# Patient Record
Sex: Male | Born: 1970 | Race: White | Hispanic: No | Marital: Married | Smoking: Former smoker
Health system: Southern US, Community
[De-identification: ages and names within clinical notes are randomized; demographics above are authoritative.]

## PROBLEM LIST (undated history)

## (undated) DIAGNOSIS — F329 Major depressive disorder, single episode, unspecified: Secondary | ICD-10-CM

## (undated) DIAGNOSIS — K760 Fatty (change of) liver, not elsewhere classified: Secondary | ICD-10-CM

## (undated) DIAGNOSIS — F32A Depression, unspecified: Secondary | ICD-10-CM

## (undated) DIAGNOSIS — E785 Hyperlipidemia, unspecified: Secondary | ICD-10-CM

## (undated) DIAGNOSIS — K219 Gastro-esophageal reflux disease without esophagitis: Secondary | ICD-10-CM

## (undated) DIAGNOSIS — I10 Essential (primary) hypertension: Secondary | ICD-10-CM

## (undated) HISTORY — DX: Major depressive disorder, single episode, unspecified: F32.9

## (undated) HISTORY — DX: Essential (primary) hypertension: I10

## (undated) HISTORY — PX: VASECTOMY: SHX75

## (undated) HISTORY — DX: Fatty (change of) liver, not elsewhere classified: K76.0

## (undated) HISTORY — DX: Gastro-esophageal reflux disease without esophagitis: K21.9

## (undated) HISTORY — DX: Hyperlipidemia, unspecified: E78.5

## (undated) HISTORY — DX: Depression, unspecified: F32.A

## (undated) HISTORY — PX: ADENOIDECTOMY: SUR15

---

## 2004-10-23 ENCOUNTER — Ambulatory Visit: Payer: Self-pay | Admitting: Family Medicine

## 2004-12-04 ENCOUNTER — Ambulatory Visit: Payer: Self-pay | Admitting: Family Medicine

## 2004-12-10 ENCOUNTER — Ambulatory Visit: Payer: Self-pay | Admitting: Family Medicine

## 2005-05-22 ENCOUNTER — Ambulatory Visit: Payer: Self-pay | Admitting: Family Medicine

## 2005-05-27 ENCOUNTER — Ambulatory Visit: Payer: Self-pay | Admitting: Family Medicine

## 2005-06-19 ENCOUNTER — Ambulatory Visit: Payer: Self-pay | Admitting: Family Medicine

## 2006-03-11 ENCOUNTER — Ambulatory Visit: Payer: Self-pay | Admitting: Family Medicine

## 2006-10-23 ENCOUNTER — Ambulatory Visit: Payer: Self-pay | Admitting: Family Medicine

## 2006-12-23 ENCOUNTER — Ambulatory Visit: Payer: Self-pay | Admitting: Family Medicine

## 2007-05-26 DIAGNOSIS — I10 Essential (primary) hypertension: Secondary | ICD-10-CM | POA: Insufficient documentation

## 2007-05-26 DIAGNOSIS — E785 Hyperlipidemia, unspecified: Secondary | ICD-10-CM | POA: Insufficient documentation

## 2007-06-18 ENCOUNTER — Ambulatory Visit: Payer: Self-pay | Admitting: Family Medicine

## 2007-06-20 LAB — CONVERTED CEMR LAB
CO2: 31 meq/L (ref 19–32)
Cholesterol: 165 mg/dL (ref 0–200)
GFR calc Af Amer: 108 mL/min
Glucose, Bld: 93 mg/dL (ref 70–99)
HDL: 51.7 mg/dL (ref >39.0)
Potassium: 4.7 meq/L (ref 3.5–5.1)
Sodium: 139 meq/L (ref 135–145)
Total CHOL/HDL Ratio: 3.2
Triglycerides: 52 mg/dL (ref 0–149)

## 2007-06-22 ENCOUNTER — Ambulatory Visit: Payer: Self-pay | Admitting: Family Medicine

## 2007-06-22 DIAGNOSIS — K219 Gastro-esophageal reflux disease without esophagitis: Secondary | ICD-10-CM | POA: Insufficient documentation

## 2007-09-20 ENCOUNTER — Ambulatory Visit: Payer: Self-pay | Admitting: Family Medicine

## 2008-01-10 ENCOUNTER — Ambulatory Visit: Payer: Self-pay | Admitting: Family Medicine

## 2008-01-10 DIAGNOSIS — F329 Major depressive disorder, single episode, unspecified: Secondary | ICD-10-CM

## 2008-01-10 DIAGNOSIS — F32A Depression, unspecified: Secondary | ICD-10-CM | POA: Insufficient documentation

## 2008-02-17 ENCOUNTER — Ambulatory Visit: Payer: Self-pay | Admitting: Family Medicine

## 2008-06-09 ENCOUNTER — Telehealth (INDEPENDENT_AMBULATORY_CARE_PROVIDER_SITE_OTHER): Payer: Self-pay | Admitting: Internal Medicine

## 2008-06-26 ENCOUNTER — Ambulatory Visit: Payer: Self-pay | Admitting: Family Medicine

## 2008-06-26 LAB — CONVERTED CEMR LAB
AST: 21 units/L (ref 0–37)
Chloride: 106 meq/L (ref 96–112)
Creatinine, Ser: 0.9 mg/dL (ref 0.4–1.5)
GFR calc non Af Amer: 100 mL/min
LDL Cholesterol: 98 mg/dL (ref 0–99)
Potassium: 4.3 meq/L (ref 3.5–5.1)
TSH: 0.73 microintl units/mL (ref 0.35–5.50)
Total Bilirubin: 0.7 mg/dL (ref 0.3–1.2)
Total CHOL/HDL Ratio: 3.1
Triglycerides: 57 mg/dL (ref 0–149)

## 2008-06-29 ENCOUNTER — Ambulatory Visit: Payer: Self-pay | Admitting: Family Medicine

## 2008-10-03 ENCOUNTER — Ambulatory Visit: Payer: Self-pay | Admitting: Family Medicine

## 2008-12-28 ENCOUNTER — Ambulatory Visit: Payer: Self-pay | Admitting: Family Medicine

## 2008-12-28 LAB — CONVERTED CEMR LAB
CO2: 32 meq/L (ref 19–32)
Chloride: 101 meq/L (ref 96–112)
Creatinine, Ser: 1 mg/dL (ref 0.4–1.5)

## 2009-04-25 ENCOUNTER — Ambulatory Visit: Payer: Self-pay | Admitting: Family Medicine

## 2009-04-25 DIAGNOSIS — S335XXA Sprain of ligaments of lumbar spine, initial encounter: Secondary | ICD-10-CM | POA: Insufficient documentation

## 2009-07-11 ENCOUNTER — Telehealth: Payer: Self-pay | Admitting: Family Medicine

## 2010-04-08 ENCOUNTER — Ambulatory Visit: Payer: Self-pay | Admitting: Family Medicine

## 2010-04-12 ENCOUNTER — Ambulatory Visit: Payer: Self-pay | Admitting: Family Medicine

## 2010-04-15 LAB — CONVERTED CEMR LAB
ALT: 29 units/L (ref 0–53)
AST: 26 units/L (ref 0–37)
Albumin: 4.3 g/dL (ref 3.5–5.2)
BUN: 10 mg/dL (ref 6–23)
CO2: 27 meq/L (ref 19–32)
Chloride: 103 meq/L (ref 96–112)
Cholesterol: 176 mg/dL (ref 0–200)
GC Probe Amp, Urine: NEGATIVE
GFR calc non Af Amer: 81.86 mL/min (ref >60)
Glucose, Bld: 92 mg/dL (ref 70–99)
Potassium: 4.1 meq/L (ref 3.5–5.1)
Total Protein: 7.2 g/dL (ref 6.0–8.3)

## 2010-10-15 NOTE — Assessment & Plan Note (Signed)
Summary: Scott Franco FROM [facility]/MED REFILL/DLO   Vital Signs:  Patient profile:   40 year old male Height:      69 inches Weight:      234 pounds BMI:     34.68 Temp:     98.4 degrees F oral Pulse rate:   64 / minute Pulse rhythm:   regular BP sitting:   140 / 90  (left arm) Cuff size:   large  Vitals Entered By: Lowella Petties CMA (2010-04-13 11:22 AM) CC: CPX- transfer from [facility]   History of Present Illness: CPE-See plan.  Asking about Std testing.  No symptoms of discharge, burning, etc.   Depression: On zoloft for last 2 years.  Started taking it in middle of maritial troubles.  No SI/HI.  "I think it helped."  Mood has been stable per patient.  He thinks that as divorce proceedings wind down his mood will continue to improve.  Sleeping fairly well.  Bright outlook EA:VWUJ and his future.   Hypertension:      Using medication without problems or lightheadedness: yes Chest pain with exertion:no Edema:no Short of breath:no Average home BPs: usually lower than today.  Other issues: no  s/p vasectomy and needs follow up sperm eval.    Problems Prior to Update: 1)  Vasectomy, Hx of  (ICD-V26.52) 2)  Lumbar Strain, Acute  (ICD-847.2) 3)  Depression  (ICD-311) 4)  Health Maintenance Exam  (ICD-V70.0) 5)  Hypertension  (ICD-401.9) 6)  Hyperlipidemia  (ICD-272.4) 7)  Gerd  (ICD-530.81)  Current Medications (verified): 1)  Lisinopril 20 Mg Tabs (Lisinopril) .... One Tab By Mouth At Night. 2)  Zoloft 50 Mg  Tabs (Sertraline Hcl) .Marland Kitchen.. 1 Once Daily For Depression  Allergies (verified): No Known Drug Allergies  Past History:  Past Medical History: Last updated: 05/25/2007 GERD Hyperlipidemia Hypertension  Past Surgical History: Last updated: 06/22/2007 Adenoidectomy 3yoa Vasectomy (Dr Reuel Boom) 01/2005  Family History: Last updated: 04/13/10 Father: deceased age 74- lung cancer, smoker. 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   Risk Factors: Smoking Status: quit (05/25/2007) Passive Smoke Exposure: no (06/29/2008)  Family History: Father: deceased age 68- lung cancer, smoker. HTN Mother: A  57 HTN Sister A 64  Social History: Reviewed history from 05/25/2007 and no changes required. 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   Review of Systems       See HPI.  Otherwise negative.    Physical Exam  General:  GEN: nad, alert and oriented, affect wnl HEENT: mucous membranes moist NECK: supple w/o LA CV: rrr.  no murmur PULM: ctab, no inc wob ABD: soft, +bs EXT: no edema SKIN: no acute rash, multiple benign appearing nevi on trunk.    Impression & Recommendations:  Problem # 1:  HEALTH MAINTENANCE EXAM (ICD-V70.0) return for labs.   Td up to date.  d/w patient re:flu.  d/w patient alcohol in moderation and exercise.   Problem # 2:  DEPRESSION (ICD-311) No change in meds.  Pt to notify me if  symptoms increase.  His updated medication list for this problem includes:    Zoloft 50 Mg Tabs (  Sertraline hcl) .Marland Kitchen... 1 once daily for depression  Problem # 3:  HYPERTENSION (ICD-401.9) Controlled.  No change in meds.  return for labs.  His updated medication list for this problem includes:    Lisinopril 20 Mg Tabs (Lisinopril) ..... One tab by mouth at night.  Problem # 4:  VASECTOMY, HX OF (ICD-V26.52) return for sperm eval.   Complete Medication List: 1)  Lisinopril 20 Mg Tabs (Lisinopril) .... One tab by mouth at night. 2)  Zoloft 50 Mg Tabs (Sertraline hcl) .Marland Kitchen.. 1 once daily for depression  Patient Instructions: 1)  return for fasting labs.   2)  CMET/lipid  401.1 3)  HIV/RPR v01.6 4)  GC/chlam-urine v01.6 5)  sperm count post vasectomy v26.52 6)  I sent your prescriptions to the pharmacy.  Prescriptions: ZOLOFT 50 MG  TABS (SERTRALINE HCL) 1 once daily for depression  #90 x 3   Entered and Authorized by:   Crawford Givens MD   Signed by:   Crawford Givens MD on 04/08/2010   Method used:   [facility]        [facility] [facility]ail[facility]       [address]       [place], [place]  [address]       Ph: [phone] or [phone]       Fax: [phone]   RxID:   [id number] LISINOPRIL 20 MG TABS (LISINOPRIL) one tab by mouth at night.  #90 x 3   Entered and Authorized by:   Crawford Givens MD   Signed by:   Crawford Givens MD on 04/08/2010   Method used:   [facility]        [facility] (retail)       [address]       [place], [place]  [address]       Ph: [phone] or [phone]       Fax: [phone]   RxID:   [id number]   Prior Medications (reviewed today): LISINOPRIL 20 MG TABS (LISINOPRIL) one tab by mouth at night. ZOLOFT 50 MG  TABS (SERTRALINE HCL) 1 once daily for depression Current Allergies (reviewed today): No known allergies

## 2010-12-03 ENCOUNTER — Emergency Department: Payer: Self-pay | Admitting: Emergency Medicine

## 2011-04-11 ENCOUNTER — Other Ambulatory Visit: Payer: Self-pay | Admitting: Family Medicine

## 2011-04-11 NOTE — Telephone Encounter (Signed)
Ok to refill? Patient was last seen over a year ago.

## 2011-04-12 NOTE — Telephone Encounter (Signed)
Needs office visit.  rx sent in meantime.  Please set up CPE.  Thanks.

## 2011-04-14 MED ORDER — LISINOPRIL 20 MG PO TABS: 20.0000 mg | ORAL_TABLET | Freq: Every day | ORAL | Status: DC

## 2011-04-14 MED ORDER — SERTRALINE HCL 50 MG PO TABS: 50.0000 mg | ORAL_TABLET | Freq: Every day | ORAL | Status: DC

## 2011-04-14 NOTE — Telephone Encounter (Signed)
rx sent to pharmacy by e-script .left message to have patient call for appt for cpx.

## 2011-04-23 ENCOUNTER — Other Ambulatory Visit: Payer: Self-pay | Admitting: Family Medicine

## 2011-04-23 DIAGNOSIS — I10 Essential (primary) hypertension: Secondary | ICD-10-CM

## 2011-04-30 ENCOUNTER — Encounter: Payer: Self-pay | Admitting: Family Medicine

## 2011-04-30 ENCOUNTER — Ambulatory Visit (INDEPENDENT_AMBULATORY_CARE_PROVIDER_SITE_OTHER): Payer: BC Managed Care – PPO | Admitting: Family Medicine

## 2011-04-30 DIAGNOSIS — R059 Cough, unspecified: Secondary | ICD-10-CM | POA: Insufficient documentation

## 2011-04-30 DIAGNOSIS — R05 Cough: Secondary | ICD-10-CM | POA: Insufficient documentation

## 2011-04-30 MED ORDER — BENZONATATE 200 MG PO CAPS: 200.0000 mg | ORAL_CAPSULE | Freq: Three times a day (TID) | ORAL | Status: AC | PRN

## 2011-04-30 NOTE — Progress Notes (Signed)
Divorce hearing is pending, 05/06/11.   duration of symptoms: almost 1 week rhinorrhea:no congestion:minimal ear pain: no sore throat:occ dry throat and raspy feeling in throat Cough:yes, occ fits of coughing, occ sputum Myalgias: occ in upper chest other concerns: no fevers, but occ feels hot.   ROS: See HPI.  Otherwise negative.    Meds, vitals, and allergies reviewed.   GEN: nad, alert and oriented HEENT: mucous membranes moist, TM w/o erythema, nasal epithelium mildly injected, OP with cobblestoning- mild NECK: supple w/o LA CV: rrr. PULM: ctab, no inc wob ABD: soft, +bs EXT: no edema

## 2011-04-30 NOTE — Patient Instructions (Signed)
Drink plenty of fluids, take tylenol as needed, and take tessalon as needed for the cough.  This should gradually improve.  Take care.  Let us know if you have other concerns.

## 2011-04-30 NOTE — Assessment & Plan Note (Signed)
Likely viral, nontoxic, and lungs ctab.  Would try tessalon prn and supportive tx o/w.  D/w pt about ddx and he agrees, f/u prn.

## 2011-05-02 ENCOUNTER — Other Ambulatory Visit (INDEPENDENT_AMBULATORY_CARE_PROVIDER_SITE_OTHER): Payer: BC Managed Care – PPO

## 2011-05-02 DIAGNOSIS — I10 Essential (primary) hypertension: Secondary | ICD-10-CM

## 2011-05-02 LAB — COMPREHENSIVE METABOLIC PANEL
ALT: 38 U/L (ref 0–53)
AST: 27 U/L (ref 0–37)
Albumin: 4.3 g/dL (ref 3.5–5.2)
CO2: 29 mEq/L (ref 19–32)
Calcium: 9.1 mg/dL (ref 8.4–10.5)
Chloride: 102 mEq/L (ref 96–112)
Creatinine, Ser: 1.1 mg/dL (ref 0.4–1.5)
GFR: 76.43 mL/min (ref >60.00)
Potassium: 4.6 mEq/L (ref 3.5–5.1)
Total Protein: 7.3 g/dL (ref 6.0–8.3)

## 2011-05-02 LAB — LIPID PANEL
LDL Cholesterol: 110 mg/dL — ABNORMAL HIGH (ref 0–99)
Total CHOL/HDL Ratio: 4
Triglycerides: 141 mg/dL (ref 0.0–149.0)

## 2011-05-12 ENCOUNTER — Encounter: Payer: Self-pay | Admitting: Family Medicine

## 2011-05-13 ENCOUNTER — Ambulatory Visit (INDEPENDENT_AMBULATORY_CARE_PROVIDER_SITE_OTHER): Payer: BC Managed Care – PPO | Admitting: Family Medicine

## 2011-05-13 ENCOUNTER — Encounter: Payer: Self-pay | Admitting: Family Medicine

## 2011-05-13 VITALS — BP 122/84 | HR 68 | Temp 98.7°F | Wt 241.1 lb

## 2011-05-13 DIAGNOSIS — R739 Hyperglycemia, unspecified: Secondary | ICD-10-CM

## 2011-05-13 DIAGNOSIS — R7309 Other abnormal glucose: Secondary | ICD-10-CM

## 2011-05-13 DIAGNOSIS — D229 Melanocytic nevi, unspecified: Secondary | ICD-10-CM

## 2011-05-13 DIAGNOSIS — D239 Other benign neoplasm of skin, unspecified: Secondary | ICD-10-CM

## 2011-05-13 DIAGNOSIS — F329 Major depressive disorder, single episode, unspecified: Secondary | ICD-10-CM

## 2011-05-13 DIAGNOSIS — F3289 Other specified depressive episodes: Secondary | ICD-10-CM

## 2011-05-13 DIAGNOSIS — I10 Essential (primary) hypertension: Secondary | ICD-10-CM

## 2011-05-13 DIAGNOSIS — Z0000 Encounter for general adult medical examination without abnormal findings: Secondary | ICD-10-CM

## 2011-05-13 NOTE — Progress Notes (Signed)
Divorce is done and equitable distribution is pending.  Split custody, alternating weeks with custody of son since this summer.  Son is adjusting.  "I'm doing okay.  I have some trouble sleeping every once in a while."  Occ with depressed mood; continued on meds with some help (it's helped with some social phobia).  No SI.  He does have hope.    Hypertension:    Using medication without problems or lightheadedness: yes Chest pain with exertion:no Edema:no Short of breath: he attributes this to deconditioning. Gradual change over the last year.  Has improved prev with weight loss.   Mild hyperglycemia.  We talked about this and prediabetes.   CPE- See plan.  Routine anticipatory guidance given to patient.  See health maintenance.  We talked about drinking less.  He's been abstinent before.    He was concerned about nevi on skin exam.   PMH and SH reviewed  Meds, vitals, and allergies reviewed.   ROS: See HPI.  Otherwise negative.    GEN: nad, alert and oriented HEENT: mucous membranes moist NECK: supple w/o LA CV: rrr. PULM: ctab, no inc wob ABD: soft, +bs EXT: no edema SKIN: no acute rash and mult nevi noted, bland in appearance

## 2011-05-13 NOTE — Patient Instructions (Addendum)
I would get a flu shot each fall.   Gradually increase your exercise and cut down on beer.   Take care.  Glad to see you. Let me know if you have concerns.   See Shirlee Limerick about your referral before your leave today.

## 2011-05-15 DIAGNOSIS — D229 Melanocytic nevi, unspecified: Secondary | ICD-10-CM | POA: Insufficient documentation

## 2011-05-15 DIAGNOSIS — Z0000 Encounter for general adult medical examination without abnormal findings: Secondary | ICD-10-CM | POA: Insufficient documentation

## 2011-05-15 DIAGNOSIS — R739 Hyperglycemia, unspecified: Secondary | ICD-10-CM | POA: Insufficient documentation

## 2011-05-15 NOTE — Assessment & Plan Note (Signed)
Healthy habits d/w pt, less etoh, more exercise.  Flu shot encouraged.  Labs d/w pt.

## 2011-05-15 NOTE — Assessment & Plan Note (Signed)
Refer to derm per pt request.  

## 2011-05-15 NOTE — Assessment & Plan Note (Signed)
Continue with current meds.  Affect wnl and no SI/HI.  Hopefully this will improve as his situation settles down into a more predictable pattern.

## 2011-05-15 NOTE — Assessment & Plan Note (Signed)
Controlled, cont current meds. D/w pt about weight loss.

## 2011-05-15 NOTE — Assessment & Plan Note (Signed)
D/w pt about diet and exercise.  Recheck periodically.  

## 2011-07-16 ENCOUNTER — Other Ambulatory Visit: Payer: Self-pay | Admitting: *Deleted

## 2011-07-16 MED ORDER — LISINOPRIL 20 MG PO TABS: 20.0000 mg | ORAL_TABLET | Freq: Every day | ORAL | Status: DC

## 2011-07-16 NOTE — Telephone Encounter (Signed)
Last refill 04/14/2011.

## 2011-07-17 MED ORDER — SERTRALINE HCL 50 MG PO TABS: 50.0000 mg | ORAL_TABLET | Freq: Every day | ORAL | Status: DC

## 2011-12-17 ENCOUNTER — Telehealth: Payer: Self-pay

## 2011-12-17 NOTE — Telephone Encounter (Signed)
Pt wants to discuss Zoloft with Dr Para March. I asked pt if he was having a problem with medication and he said no. Pt said he would tell me it had to do with decreased sex drive but other than that he wanted to speak with Dr Para March. [facility] uses [facility] and can be reached at [phone].

## 2011-12-17 NOTE — Telephone Encounter (Signed)
Patient notified as instructed by telephone. Pt said it would be fine to come in and talk with Dr Para March. Jamie scheduled for 30 min appt on 12/22/11 at 9:15am.

## 2011-12-17 NOTE — Telephone Encounter (Signed)
He likely needs an OV.  If there is any concern about low testosterone, get him an early AM OV so we can discuss and potentially do any labs the same day.  Doesn't have to fast.  would be useful.

## 2011-12-22 ENCOUNTER — Encounter: Payer: Self-pay | Admitting: Family Medicine

## 2011-12-22 ENCOUNTER — Ambulatory Visit (INDEPENDENT_AMBULATORY_CARE_PROVIDER_SITE_OTHER): Payer: BC Managed Care – PPO | Admitting: Family Medicine

## 2011-12-22 VITALS — BP 116/70 | HR 69 | Temp 98.6°F | Wt 231.0 lb

## 2011-12-22 DIAGNOSIS — N529 Male erectile dysfunction, unspecified: Secondary | ICD-10-CM

## 2011-12-22 MED ORDER — SILDENAFIL CITRATE 100 MG PO TABS: 50.0000 mg | ORAL_TABLET | Freq: Every day | ORAL | Status: DC | PRN

## 2011-12-22 NOTE — Progress Notes (Signed)
Divorce is done and equitable distribution is done. Split custody, alternating weeks with custody of son since last summer.   Sleep is okay and mood is okay.  Continued on SSRI with some help (it's helped with some social phobia).  He quit drinking and started exercising.  His boss retired and he got a new boss that he likes.  All of that helped.  No SI/HI.  He does have hope.   Has a new girlfriend.  He had some mild trouble with sex drive but also had an episode of ED with her.  Still can get AM erection.  On SSRI.  No etoh now.    Meds, vitals, and allergies reviewed.   ROS: See HPI.  Otherwise, noncontributory.  GEN: nad, alert and oriented NECK: supple w/o LA CV: rrr.  PULM: ctab, no inc wob ABD: soft, +bs EXT: no edema SKIN: no acute rash

## 2011-12-22 NOTE — Assessment & Plan Note (Signed)
We may need to change the SSRI in the future.  In meantime, I would try prn viagra with routine instructions.  That may be all he needs to overcome the symptoms and return to typical function.  He agrees.  I doubt low T with the normal AM erections and still with fair libido.  Social considerations likely play a role.  Partner is supportive.

## 2011-12-22 NOTE — Patient Instructions (Signed)
Try the viagra and see if that helps.  Let me know if you have concerns. Glad to see you.

## 2012-02-19 IMAGING — CR DG HAND COMPLETE 3+V*L*
1 series · 3 of 3 positions shown · non-contrast
Comparison: none

REASON FOR EXAM: pain, swelling    RME 3
COMMENTS:   LMP: (Male)

[Series 1: view not recorded · 0.17mm/px · 3 of 3 slices shown]
[im 1/3]
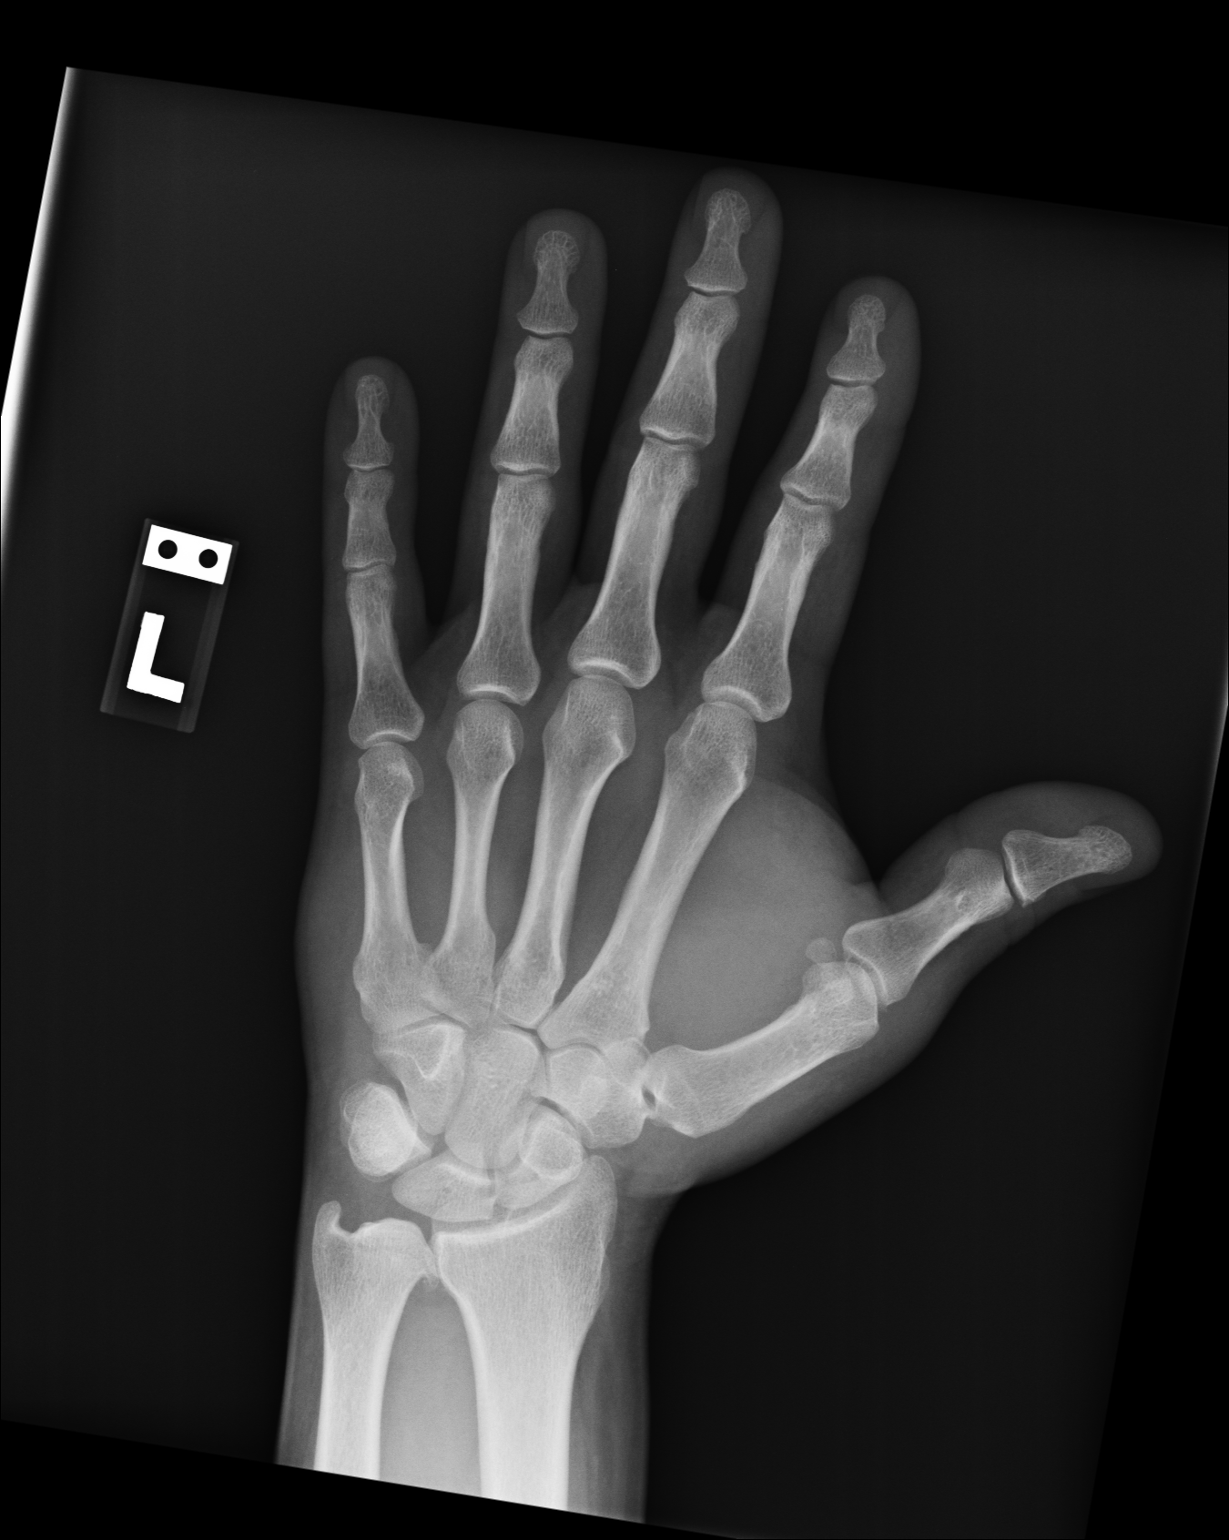
[im 2/3]
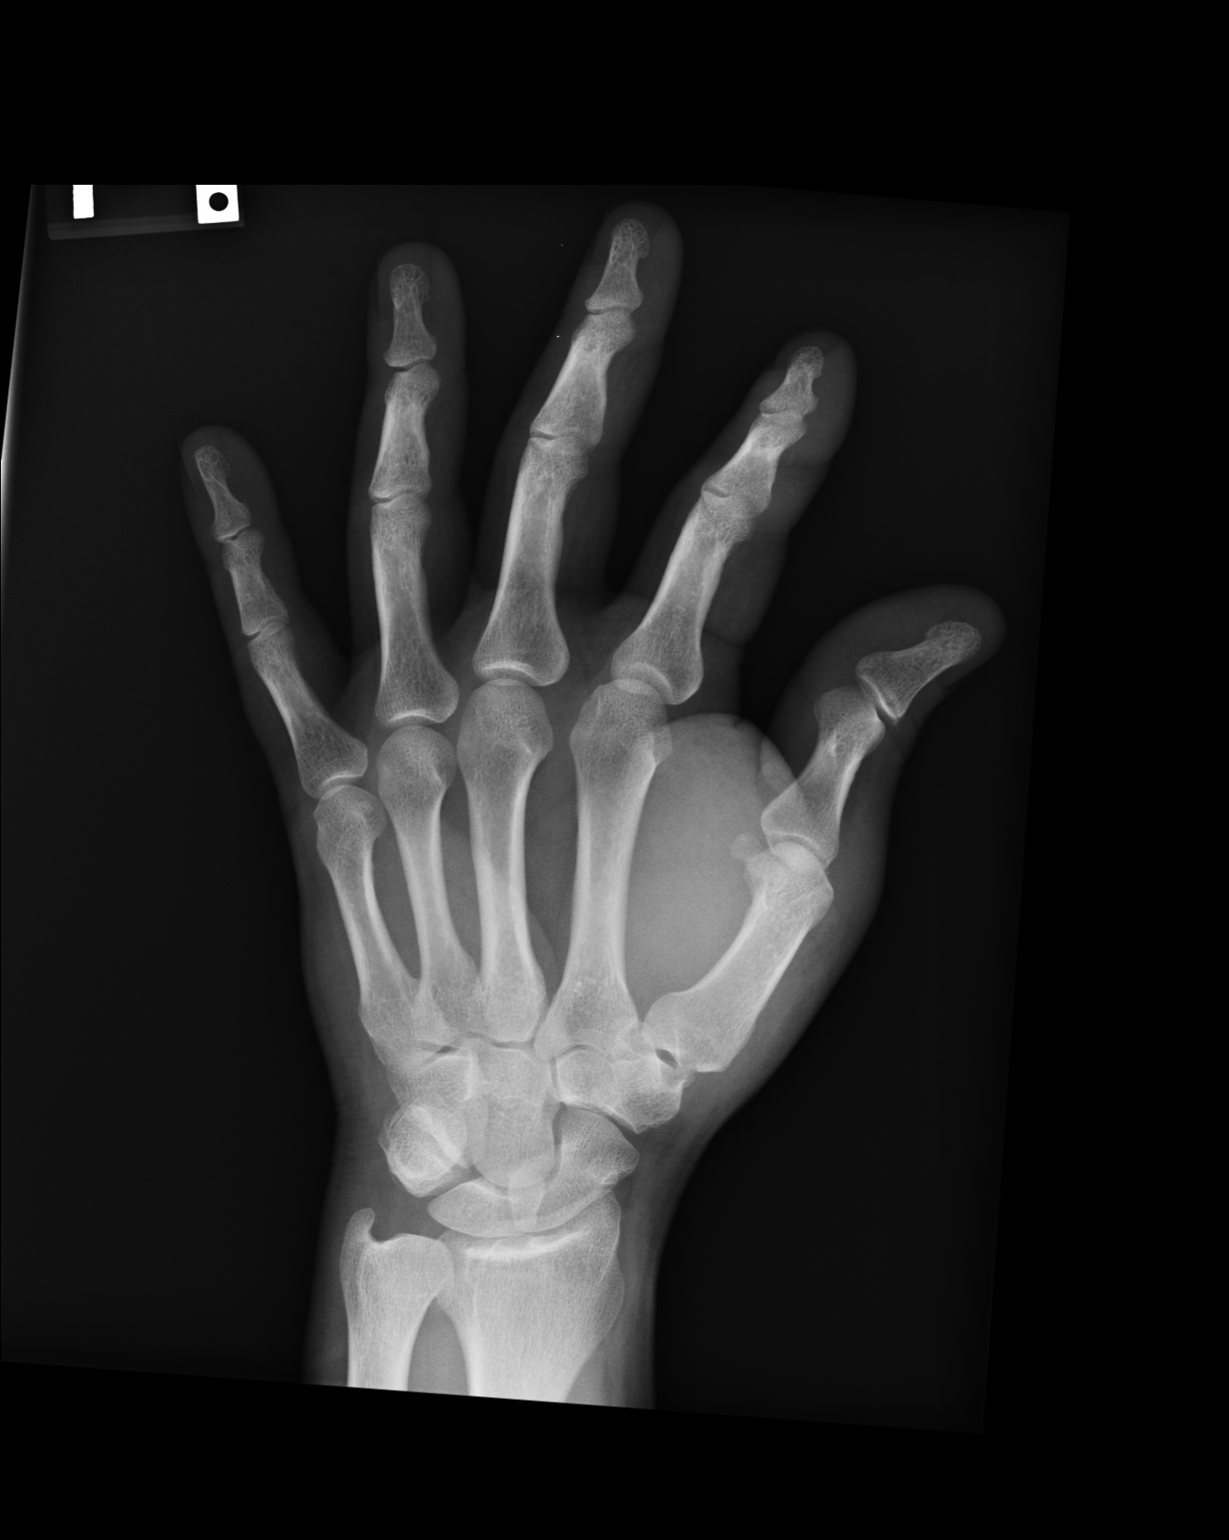
[im 3/3]
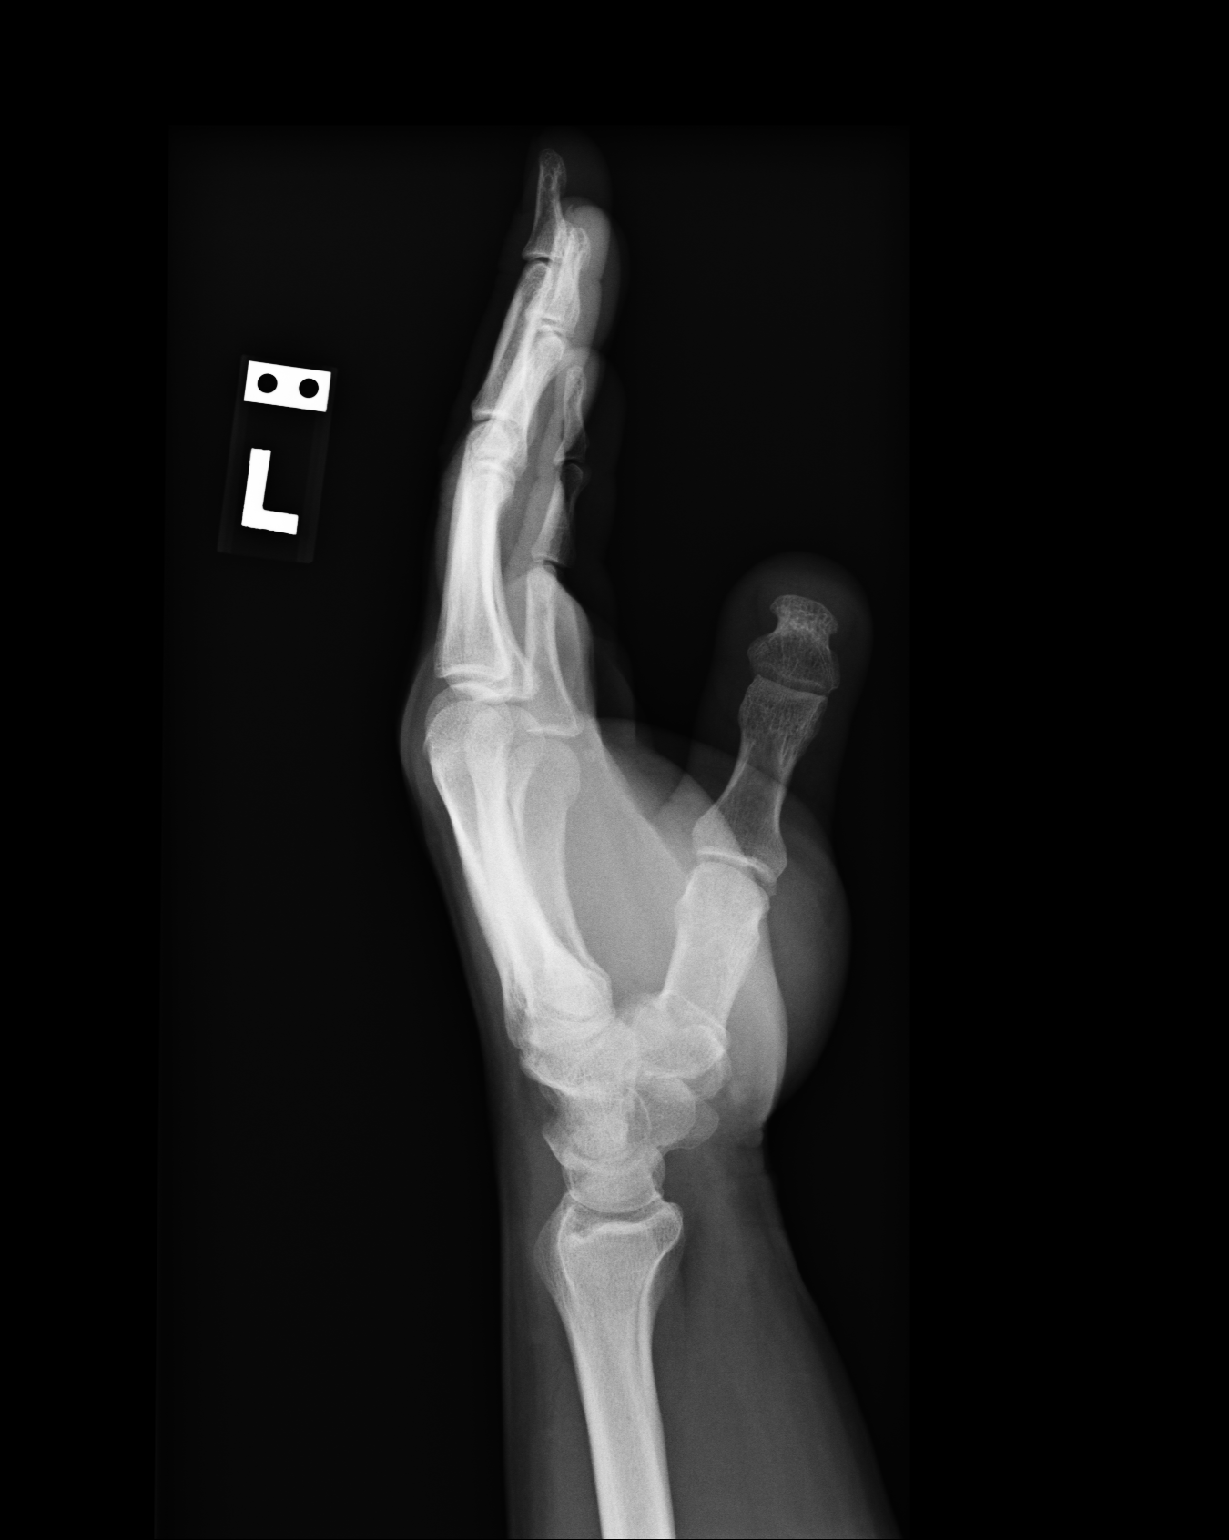

[3 of 3 positions shown; findings below may reference images not displayed]

PROCEDURE:     DXR - DXR HAND LT COMPLETE  W/OBLIQUES  - December 03, 2010  [DATE]

RESULT:     Three views of the left hand are submitted. The bones are
adequately mineralized. There is mild prominence of the soft tissues of the
thenar eminence. I see no fracture nor dislocation nor radiopaque foreign
body.
IMPRESSION: There is soft tissue swelling in the thenar region.
Correlation with patient's clinical and laboratory values is needed.

## 2012-04-07 ENCOUNTER — Other Ambulatory Visit: Payer: Self-pay | Admitting: *Deleted

## 2012-04-07 MED ORDER — LISINOPRIL 20 MG PO TABS: 20.0000 mg | ORAL_TABLET | Freq: Every day | ORAL | Status: DC

## 2012-04-07 NOTE — Telephone Encounter (Signed)
Received faxed refill request from pharmacy. Refill sent to pharmacy electronically. 

## 2012-07-06 ENCOUNTER — Other Ambulatory Visit: Payer: Self-pay | Admitting: *Deleted

## 2012-07-06 MED ORDER — SERTRALINE HCL 50 MG PO TABS: 50.0000 mg | ORAL_TABLET | Freq: Every day | ORAL | Status: DC

## 2012-07-22 ENCOUNTER — Other Ambulatory Visit: Payer: Self-pay | Admitting: *Deleted

## 2012-07-23 ENCOUNTER — Other Ambulatory Visit: Payer: Self-pay | Admitting: *Deleted

## 2012-07-23 MED ORDER — LISINOPRIL 20 MG PO TABS: 20.0000 mg | ORAL_TABLET | Freq: Every day | ORAL | Status: DC

## 2012-08-20 ENCOUNTER — Encounter: Payer: Self-pay | Admitting: Family Medicine

## 2012-08-20 ENCOUNTER — Ambulatory Visit (INDEPENDENT_AMBULATORY_CARE_PROVIDER_SITE_OTHER): Payer: BC Managed Care – PPO | Admitting: Family Medicine

## 2012-08-20 VITALS — BP 124/80 | HR 62 | Temp 99.0°F | Wt 236.8 lb

## 2012-08-20 DIAGNOSIS — B9789 Other viral agents as the cause of diseases classified elsewhere: Secondary | ICD-10-CM

## 2012-08-20 DIAGNOSIS — J029 Acute pharyngitis, unspecified: Secondary | ICD-10-CM

## 2012-08-20 DIAGNOSIS — I10 Essential (primary) hypertension: Secondary | ICD-10-CM

## 2012-08-20 DIAGNOSIS — B349 Viral infection, unspecified: Secondary | ICD-10-CM

## 2012-08-20 LAB — POCT RAPID STREP A (OFFICE): Rapid Strep A Screen: NEGATIVE

## 2012-08-20 MED ORDER — LISINOPRIL 20 MG PO TABS: 20.0000 mg | ORAL_TABLET | Freq: Every day | ORAL | Status: DC

## 2012-08-20 NOTE — Patient Instructions (Signed)
Come back for fasting labs.   You can get results through the computer.   Take care.

## 2012-08-20 NOTE — Progress Notes (Signed)
RST neg.    In last 2 weeks he started having a tickle in his throat.  Sick contacts, girlfriend.  Then had ST, swollen glands in neck. Back aches and myalgias. He has some sweats at night recently.  He feels some better today except for feeling mildly sweaty.  Clearing his throat more than true cough.  Voice was altered prev, but resolved now.    Meds, vitals, and allergies reviewed.   ROS: See HPI.  Otherwise, noncontributory.  GEN: nad, alert and oriented HEENT: mucous membranes moist, tm w/o erythema, nasal exam w/o erythema, scant clear discharge noted,  OP with very mild cobblestoning NECK: supple w/o LA CV: rrr.   PULM: ctab, no inc wob EXT: no edema SKIN: no acute rash

## 2012-08-22 DIAGNOSIS — B349 Viral infection, unspecified: Secondary | ICD-10-CM | POA: Insufficient documentation

## 2012-08-22 NOTE — Assessment & Plan Note (Signed)
Controlled, return for fasting labs and continue current meds.

## 2012-08-22 NOTE — Assessment & Plan Note (Signed)
RST neg, likely viral and resolving.  Nontoxic.  Reassured.

## 2012-08-25 ENCOUNTER — Other Ambulatory Visit (INDEPENDENT_AMBULATORY_CARE_PROVIDER_SITE_OTHER): Payer: BC Managed Care – PPO

## 2012-08-25 DIAGNOSIS — I10 Essential (primary) hypertension: Secondary | ICD-10-CM

## 2012-08-25 LAB — COMPREHENSIVE METABOLIC PANEL
BUN: 19 mg/dL (ref 6–23)
CO2: 28 mEq/L (ref 19–32)
Calcium: 9 mg/dL (ref 8.4–10.5)
Chloride: 100 mEq/L (ref 96–112)
Creatinine, Ser: 1 mg/dL (ref 0.4–1.5)
GFR: 84.6 mL/min (ref >60.00)
Total Bilirubin: 1 mg/dL (ref 0.3–1.2)

## 2012-08-25 LAB — LIPID PANEL
Cholesterol: 170 mg/dL (ref 0–200)
HDL: 30.1 mg/dL — ABNORMAL LOW (ref >39.00)
LDL Cholesterol: 117 mg/dL — ABNORMAL HIGH (ref 0–99)
Triglycerides: 114 mg/dL (ref 0.0–149.0)

## 2012-08-29 ENCOUNTER — Encounter: Payer: Self-pay | Admitting: Family Medicine

## 2012-10-30 ENCOUNTER — Other Ambulatory Visit: Payer: Self-pay

## 2012-11-01 ENCOUNTER — Other Ambulatory Visit: Payer: Self-pay | Admitting: *Deleted

## 2012-11-01 ENCOUNTER — Other Ambulatory Visit: Payer: Self-pay

## 2012-11-01 MED ORDER — SERTRALINE HCL 50 MG PO TABS: 50.0000 mg | ORAL_TABLET | Freq: Every day | ORAL | Status: DC

## 2012-11-01 NOTE — Telephone Encounter (Signed)
Pt left v/m requesting refill sertraline to [phone] x [facility]. Patient notified  by telephone v/m that refill done.

## 2012-12-24 ENCOUNTER — Other Ambulatory Visit: Payer: Self-pay | Admitting: Family Medicine

## 2012-12-24 NOTE — Telephone Encounter (Signed)
Electronic refill request.  Please advise. 

## 2012-12-24 NOTE — Telephone Encounter (Signed)
Sent!

## 2013-02-01 ENCOUNTER — Other Ambulatory Visit: Payer: Self-pay | Admitting: Family Medicine

## 2013-03-08 ENCOUNTER — Ambulatory Visit: Payer: BC Managed Care – PPO | Admitting: Family Medicine

## 2013-03-22 ENCOUNTER — Encounter: Payer: Self-pay | Admitting: Family Medicine

## 2013-03-22 ENCOUNTER — Ambulatory Visit (INDEPENDENT_AMBULATORY_CARE_PROVIDER_SITE_OTHER): Payer: BC Managed Care – PPO | Admitting: Family Medicine

## 2013-03-22 VITALS — BP 142/76 | HR 76 | Temp 98.2°F | Wt 241.5 lb

## 2013-03-22 DIAGNOSIS — T465X5A Adverse effect of other antihypertensive drugs, initial encounter: Secondary | ICD-10-CM

## 2013-03-22 DIAGNOSIS — F329 Major depressive disorder, single episode, unspecified: Secondary | ICD-10-CM

## 2013-03-22 DIAGNOSIS — T464X5A Adverse effect of angiotensin-converting-enzyme inhibitors, initial encounter: Secondary | ICD-10-CM

## 2013-03-22 DIAGNOSIS — F3289 Other specified depressive episodes: Secondary | ICD-10-CM

## 2013-03-22 DIAGNOSIS — T783XXA Angioneurotic edema, initial encounter: Secondary | ICD-10-CM | POA: Insufficient documentation

## 2013-03-22 NOTE — Assessment & Plan Note (Signed)
Stop ACE, no ARB in future.  D/w pt.  Understood.  Would not tx other than stop ACE for now.  If worsening, to ER or 911.  He understood.  He'll update me and we can address BP in the future.

## 2013-03-22 NOTE — Patient Instructions (Addendum)
Stop the lisinopril.  Don't ever take it again.  Call me with an update tomorrow.  If you have progressive swelling, then dial 911 or go to ER immediately.  We'll address your BP in the future when the swelling is down.  Don't ever take ACE inhibitors or ARBs for BP.  Take care.

## 2013-03-22 NOTE — Assessment & Plan Note (Signed)
Off SSRI and mood is good. Has been off about 1.5-2 months. Doing well w/o depressive sx.

## 2013-03-22 NOTE — Progress Notes (Signed)
Off SSRI and mood is good. Has been off about 1.5-2 months.  Doing well w/o depressive sx.    Lip swelling.  Started about 2 weeks ago.  Was getting out of bed late at night.  Noted a swollen area on his L foot, plantar side. Didn't know if it was from walking on gravel earlier in the day  Then he has B foot swelling noted.  He went to work, foot swelling continued.  He went to walk in clinic.  He had pulled some ticks.  He was put on doxycycline for 10 days.  Eval was unremarkable; he was told it may have been hives.  Swelling went away in the next day.  He felt well until this weekend. R 4th finger and hand had some swelling.  He also had a rash on the R arm.  All the arm/hand rash and swelling resolved.  Yesterday he had lip swelling.  On ACE, has been on it for years.  Not SOB, no CP.  Tongue feels normal.  Swallowing well.  The only swelling he has is on the lips now.  He feels well o/w.  No new foods or triggers o/w known.    He had moved recently.  Also recently engaged.    Meds, vitals, and allergies reviewed.   ROS: See HPI.  Otherwise, noncontributory.  nad ncat except for B upper and lower lip edema w/o fluctuant mass.  Tm wnl  Nasal and OP exam wnl w/o tongue edema Neck supple, no LA No stridor rrr ctab abd soft Ext w/o edema Skin w/o rash

## 2013-04-29 ENCOUNTER — Telehealth: Payer: Self-pay

## 2013-04-29 NOTE — Telephone Encounter (Signed)
I called pt.  I would stay off the medicine for now and check BP a few more time and then report back.  I wouldn't start back on the lisinopril now.  He agrees.

## 2013-04-29 NOTE — Telephone Encounter (Signed)
Pt left v/m requesting new BP med; pt seen 03/22/13 and BP med was stopped. Pt said swelling in lips went away but swelling in arms and legs continued for 2 weeks but that swelling went away also. Today pt had itching in rt ring finger and then swelling. Pt said on and off since seen he will itch and that part of body will swell. Pt checked BP 2 weeks ago and BP was 140/99. No h/a, dizziness, CP or SOB. Pt said he cannot leave work today because pt is only one working.  Pt does not think Lisinopril caused swelling before and wants to know if can restart Lisinopril or what BP med would Dr Para March prescribe. [facility]. Pt request cb.advised pt if condition changes or worsens to call office back or go to UC.

## 2013-05-17 ENCOUNTER — Emergency Department: Payer: Self-pay | Admitting: Emergency Medicine

## 2013-05-17 LAB — COMPREHENSIVE METABOLIC PANEL
Albumin: 3.8 g/dL (ref 3.4–5.0)
Alkaline Phosphatase: 87 U/L (ref 50–136)
Anion Gap: 7 (ref 7–16)
Chloride: 104 mmol/L (ref 98–107)
Co2: 27 mmol/L (ref 21–32)
Creatinine: 1.22 mg/dL (ref 0.60–1.30)
EGFR (African American): >60
Glucose: 107 mg/dL — ABNORMAL HIGH (ref 65–99)
Osmolality: 278 (ref 275–301)
SGOT(AST): 27 U/L (ref 15–37)
SGPT (ALT): 26 U/L (ref 12–78)
Sodium: 138 mmol/L (ref 136–145)

## 2013-05-17 LAB — CBC
HCT: 46 % (ref 40.0–52.0)
MCHC: 34.9 g/dL (ref 32.0–36.0)
RBC: 5.15 10*6/uL (ref 4.40–5.90)
WBC: 7.3 10*3/uL (ref 3.8–10.6)

## 2013-05-20 ENCOUNTER — Encounter: Payer: Self-pay | Admitting: Cardiovascular Disease

## 2013-05-20 ENCOUNTER — Ambulatory Visit (INDEPENDENT_AMBULATORY_CARE_PROVIDER_SITE_OTHER): Payer: BC Managed Care – PPO | Admitting: Cardiovascular Disease

## 2013-05-20 VITALS — BP 150/80 | HR 81 | Ht 70.0 in | Wt 238.0 lb

## 2013-05-20 VITALS — BP 153/89 | HR 56 | Ht 70.0 in | Wt 238.0 lb

## 2013-05-20 DIAGNOSIS — R079 Chest pain, unspecified: Secondary | ICD-10-CM

## 2013-05-20 DIAGNOSIS — R0789 Other chest pain: Secondary | ICD-10-CM | POA: Insufficient documentation

## 2013-05-20 DIAGNOSIS — I10 Essential (primary) hypertension: Secondary | ICD-10-CM

## 2013-05-20 MED ORDER — HYDROCHLOROTHIAZIDE 12.5 MG PO CAPS: 12.5000 mg | ORAL_CAPSULE | Freq: Every day | ORAL | Status: DC

## 2013-05-20 NOTE — Progress Notes (Signed)
Primary care physician: Dr. Para March  HPI  This is a 42 year old Caucasian male who was referred from the emergency room at [facility] for evaluation of chest pain. He has no previous cardiac history. He has known history of hypertension and was treated in the past with lisinopril which was discontinued this year due to concerns about possible angioedema. He also has erectile dysfunction he is a previous smoker with no family history of premature coronary artery disease. Recently on a Tuesday after he went to his work, he started having substernal tightness feeling without radiation. This was followed by heartburn. It happened shortly after breakfast. The discomfort lasted for about 2 hours and thus he went to the emergency room at [facility]. Basic labs were unremarkable including negative cardiac enzymes. EKG shows no acute ischemic changes. Chest x-ray was unremarkable. He was given GI cocktail with resolution of symptoms. He has not had any recurrent chest or dyspnea. He denies any exertional symptoms. He reports that his blood pressure has been running high recently at home since he was taken off lisinopril.  Allergies  Allergen Reactions  . Ace Inhibitors     angioedema  . Angiotensin Receptor Blockers     Angioedema with ACE     Current Outpatient Prescriptions on File Prior to Visit  Medication Sig Dispense Refill  . VIAGRA 100 MG tablet TAKE 1/2 TO 1 TABLET BY MOUTH DAILY AS NEEDED FOR ED  10 tablet  5   No current facility-administered medications on file prior to visit.     Past Medical History  Diagnosis Date  . Depression   . Hypertension   . GERD (gastroesophageal reflux disease)   . Hyperlipidemia      Past Surgical History  Procedure Laterality Date  . Vasectomy    . Adenoidectomy       Family History  Problem Relation Age of Onset  . Hypertension Mother   . Cancer Father     Lung CA, smoker  . Hypertension Father   . Prostate cancer Neg Hx   . Colon cancer Neg Hx       History   Social History  . Marital Status: Married    Spouse Name: N/A    Number of Children: 2  . Years of Education: N/A   Occupational History  . Naval architect    Social History Main Topics  . Smoking status: Former Smoker -- 0.50 packs/day for 14 years    Types: Cigarettes    Quit date: 09/15/1997  . Smokeless tobacco: Not on file  . Alcohol Use: No     Comment: minimal as of 2013, prev more  . Drug Use: No  . Sexual Activity: Not on file   Other Topics Concern  . Not on file   Social History Narrative   From [facility].     Married 1998, Divorced 2011   Joint custody after divorce     ROS A 10 point review of system was performed. It's negative other than what's mentioned in the history of present illness.  PHYSICAL EXAM  BP 153/89  Pulse 56  Ht 5\' 10"  (1.778 m)  Wt 238 lb (107.956 kg)  BMI 34.15 kg/m2  Constitutional: He is oriented to person, place, and time. He appears well-developed and well-nourished. No distress.  HENT: No nasal discharge.  Head: Normocephalic and atraumatic.  Eyes: Pupils are equal and round. Right eye exhibits no discharge. Left eye exhibits no discharge.  Neck: Normal range of motion.  Neck supple. No JVD present. No thyromegaly present.  Cardiovascular: Normal rate, regular rhythm, normal heart sounds and. Exam reveals no gallop and no friction rub. No murmur heard.  Pulmonary/Chest: Effort normal and breath sounds normal. No stridor. No respiratory distress. He has no wheezes. He has no rales. He exhibits no tenderness.  Abdominal: Soft. Bowel sounds are normal. He exhibits no distension. There is no tenderness. There is no rebound and no guarding.  Musculoskeletal: Normal range of motion. He exhibits no edema and no tenderness.  Neurological: He is alert and oriented to person, place, and time. Coordination normal.  Skin: Skin is warm and dry. No rash noted. He is not diaphoretic. No erythema. No pallor.    Psychiatric: He has a normal mood and affect. His behavior is normal. Judgment and thought content normal.      EKG: Sinus  Bradycardia  WITHIN NORMAL LIMITS     ASSESSMENT AND PLAN

## 2013-05-20 NOTE — Patient Instructions (Addendum)
Your physician has requested that you have an exercise tolerance test. For further information please visit [email or web address] Please also follow instruction sheet, as given.  Start Hydrochlorothiazide 12.5 mg once daily.

## 2013-05-20 NOTE — Assessment & Plan Note (Signed)
His blood pressure has been running high since he has been off lisinopril. Thus, I started him today on small dose hydrochlorothiazide 12.5 mg once daily. I advised him to followup with Dr. Para March in 2-4 weeks to recheck his blood pressure and make sure he has no recurrent symptoms of chest pain.

## 2013-05-20 NOTE — Assessment & Plan Note (Signed)
The patient's chest pain is overall atypical and nonexertional. Most likely GI in etiology. However, with his risk factors for coronary artery disease, I proceeded with a treadmill stress test which showed no evidence of ischemia. He had no reproducible symptoms of chest pain with exercise. He can followup with me as needed.

## 2013-05-20 NOTE — Patient Instructions (Addendum)
Your stress test is normal.  Follow up with Dr. Para March in 2-4 weeks to recheck on blood pressure

## 2013-05-20 NOTE — Procedures (Signed)
    Treadmill Stress test  Indication: Atypical chest pain.  Baseline Data:  Resting EKG shows NSR with rate of 77 bpm, no significant ST or T wave changes. Resting blood pressure of 150/80 mm Hg Stand bruce protocal was used.  Exercise Data:  Patient exercised for 7 min 0 sec,  Peak heart rate of 168 bpm.  This was 95 % of the maximum predicted heart rate. No symptoms of chest pain or lightheadedness were reported at peak stress or in recovery.  Peak Blood pressure recorded was 230/84 Maximal work level: 10.1 METs.  Heart rate at 3 minutes in recovery was 103 bpm. BP response: Hypertensive HR response: Normal  EKG with Exercise: Sinus tachycardia with no significant ST changes.  FINAL IMPRESSION: Normal exercise stress test. No significant EKG changes concerning for ischemia. Good exercise tolerance. Hypertensive response to exercise.  Recommendation: Blood pressure control.

## 2013-07-21 ENCOUNTER — Other Ambulatory Visit: Payer: Self-pay

## 2013-10-04 ENCOUNTER — Encounter: Payer: Self-pay | Admitting: Family Medicine

## 2013-10-04 ENCOUNTER — Ambulatory Visit (INDEPENDENT_AMBULATORY_CARE_PROVIDER_SITE_OTHER): Payer: BC Managed Care – PPO | Admitting: Family Medicine

## 2013-10-04 VITALS — BP 124/84 | HR 72 | Temp 98.4°F | Wt 230.8 lb

## 2013-10-04 DIAGNOSIS — T783XXA Angioneurotic edema, initial encounter: Secondary | ICD-10-CM

## 2013-10-04 DIAGNOSIS — R609 Edema, unspecified: Secondary | ICD-10-CM

## 2013-10-04 NOTE — Progress Notes (Signed)
Pre-visit discussion using our clinic review tool. No additional management support is needed unless otherwise documented below in the visit note.  Had chest sx and went to ER back in 2014.  Then he had prev cards eval with likely noncardiac pain.  Treadmill test was neg per cards.   Prev with lip edema in 2014, possibly due to ACE.  Off ACE for about 6 months.  He has had some occ hand/finger and foot edema.  He had another episode of lip edema in the meantime.  He has occ unilateral eyelid edema.  He had one episode of tongue swelling about 2 months ago.  All off the episodes were self limited, self resolving.  He is still on HCTZ and prn viagra.  The swelling doesn't appear to be related to the current meds.  He didn't know if this was an allergy that was causing the sx.     He has L sided upper and lower lip swelling yesterday without a clear trigger.  His diet isn't altered.  He didn't know if his stress level was contributing.  He is off zoloft.  He does occ get anxious.  Zoloft helped with that prev.  He can occ get some mild itching with the local swelling.    Meds, vitals, and allergies reviewed.   ROS: See HPI.  Otherwise, noncontributory.  GEN: nad, alert and oriented HEENT: mucous membranes moist, lipids and tongue wnl, OP wnl, eyelids wnl NECK: supple w/o LA CV: rrr PULM: ctab, no inc wob ABD: soft, +bs EXT: no edema SKIN: no acute rash except for mild irritation on the neck that corresponds to an area of rub from his shirt collar.

## 2013-10-04 NOTE — Assessment & Plan Note (Signed)
Possible dx.  Likely not from ACE; has been off for ~6 months.  Unclear source.  Normal exam now. Refer to allergy clinic.  He agrees.  Okay for outpatient f/u.  No airway sx.  Wouldn't change meds at this point.

## 2013-10-04 NOTE — Patient Instructions (Signed)
Don't change your meds for now.  Rosaria Ferries will call about your referral. Burnis Medin go from there.  Take care.

## 2013-12-21 ENCOUNTER — Other Ambulatory Visit: Payer: Self-pay | Admitting: Cardiovascular Disease

## 2014-01-23 ENCOUNTER — Other Ambulatory Visit: Payer: Self-pay

## 2014-01-23 MED ORDER — SERTRALINE HCL 50 MG PO TABS: ORAL_TABLET | ORAL | Status: DC

## 2014-01-23 NOTE — Telephone Encounter (Signed)
Left message on voice mail  to call back

## 2014-01-23 NOTE — Telephone Encounter (Signed)
I went ahead and sent this but please clarify with patient- I thought he had come off it.  Thanks .

## 2014-01-23 NOTE — Telephone Encounter (Signed)
Pt left v/m requesting refill zoloft [facility];last refilled # 90 on 11/01/12. And pt last seen 10/04/13.Please advise.

## 2014-01-24 NOTE — Telephone Encounter (Signed)
Spoke with patient and he stated that he spoke with Dr. Damita Dunnings in January and he restarted the medication. Advised that med sent in

## 2014-02-21 ENCOUNTER — Other Ambulatory Visit: Payer: Self-pay | Admitting: Cardiovascular Disease

## 2014-04-22 ENCOUNTER — Other Ambulatory Visit: Payer: Self-pay | Admitting: Cardiovascular Disease

## 2014-05-08 ENCOUNTER — Telehealth: Payer: Self-pay

## 2014-05-08 NOTE — Telephone Encounter (Signed)
He needs OV soon to discuss CP with SOB, can address BP at that time. Limit exertion in the meantime.  If sig CP in meantime, then to ER.

## 2014-05-08 NOTE — Telephone Encounter (Signed)
Pt has been monitoring BP recently and BP averaging 160/89. Pt has been off Lisinopril 20 mg close to a year. Pt wants to restart the Lisinopril; pt presently taking HCTZ and not having problem with swelling. Pt has no H/A, dizziness, CP but upon exertion pt can get SOB. Pt does not want to schedule appt to come in to discuss BP. [facility].Please advise. Pt request cb.

## 2014-05-08 NOTE — Telephone Encounter (Signed)
Pt left v/m returning call. Pt request cb [id number]. Spoke with pt and he scheduled appt on 05/10/14 at [facility] with Dr Damita Dunnings. Pt was notified as instructed and if pt condition changes or worsens prior to appt pt will go to ED for eval.

## 2014-05-10 ENCOUNTER — Encounter: Payer: Self-pay | Admitting: Family Medicine

## 2014-05-10 ENCOUNTER — Ambulatory Visit (INDEPENDENT_AMBULATORY_CARE_PROVIDER_SITE_OTHER): Payer: BC Managed Care – PPO | Admitting: Family Medicine

## 2014-05-10 VITALS — BP 144/84 | HR 64 | Temp 98.2°F | Wt 211.5 lb

## 2014-05-10 DIAGNOSIS — N529 Male erectile dysfunction, unspecified: Secondary | ICD-10-CM

## 2014-05-10 DIAGNOSIS — I10 Essential (primary) hypertension: Secondary | ICD-10-CM

## 2014-05-10 MED ORDER — AMLODIPINE BESYLATE 5 MG PO TABS: 2.5000 mg | ORAL_TABLET | Freq: Every day | ORAL | Status: DC

## 2014-05-10 NOTE — Progress Notes (Signed)
Pre visit review using our clinic review tool, if applicable. No additional management support is needed unless otherwise documented below in the visit note.  H/o possible angioedema, lip swelling, d/w pt about ACE contraindication.  No clear cause noted for presumed angioedema.  No sx in the meantime, none since 09/2013.  Didn't have allergy eval.    More recently with inc in BP.  157W systolic recently.  Checked by a nurse out of the clinic, on mult checks.  No CP.  He didn't really have SOB- I clarified this with patient.  No swelling.  Still on HCTZ.    Down 30 lbs in the last year, going to the gym several times a week up to 1 hour of cardio and then 1 hour of weights at a time.  Good exercise tolerance.    Meds, vitals, and allergies reviewed.   ROS: See HPI.  Otherwise, noncontributory.  GEN: nad, alert and oriented HEENT: mucous membranes moist NECK: supple w/o LA CV: rrr.  no murmur PULM: ctab, no inc wob EXT: no edema

## 2014-05-10 NOTE — Assessment & Plan Note (Signed)
Improved with viagra.  He asked about cialis rx.  I would prefer to regulate his BP first, he'll update me later on.  He agrees.

## 2014-05-10 NOTE — Assessment & Plan Note (Signed)
Not sig up today, he'll recheck BP on mult cuffs out of clinic.  If elevated, then start 2.5mg  of amlodipine, up to 5mg  if needed.  Continue HCTZ, diet and exercise.  Intentional weight loss noted.

## 2014-05-10 NOTE — Patient Instructions (Signed)
Verify your BP on two cuffs.  If consistently >140/>90, then add on 2.5mg  of amlodipine.  If needed after 1 week, increase to 5mg  a day.  Take care.  Glad to see you.

## 2014-06-21 ENCOUNTER — Other Ambulatory Visit: Payer: Self-pay

## 2014-06-21 MED ORDER — HYDROCHLOROTHIAZIDE 12.5 MG PO CAPS: ORAL_CAPSULE | ORAL | Status: DC

## 2014-06-21 NOTE — Telephone Encounter (Signed)
Refill sent for HCTZ 12.5 mg  °

## 2014-06-30 ENCOUNTER — Other Ambulatory Visit: Payer: Self-pay

## 2014-07-18 ENCOUNTER — Other Ambulatory Visit: Payer: Self-pay | Admitting: Family Medicine

## 2014-07-28 ENCOUNTER — Telehealth: Payer: Self-pay

## 2014-07-28 NOTE — Telephone Encounter (Signed)
Pt called for refill on HCTZ; spoke with Merrilee Seashore at [facility] and pt has refill available; Merrilee Seashore will get ready for pick up and pt advised.

## 2014-08-12 ENCOUNTER — Other Ambulatory Visit: Payer: Self-pay | Admitting: Cardiovascular Disease

## 2014-08-28 ENCOUNTER — Other Ambulatory Visit: Payer: Self-pay

## 2014-08-28 MED ORDER — HYDROCHLOROTHIAZIDE 12.5 MG PO CAPS: ORAL_CAPSULE | ORAL | Status: DC

## 2014-08-28 NOTE — Telephone Encounter (Signed)
Sent. Thanks.   

## 2014-08-28 NOTE — Telephone Encounter (Signed)
Pt left v/m requesting refill HCTZ; Dr Damita Dunnings has not filled before but pt is no longer seeing Dr Fletcher Anon; pt request cb. [facility].pt last seen 05/10/14 and does not have future appt scheduled.

## 2014-10-08 ENCOUNTER — Other Ambulatory Visit: Payer: Self-pay | Admitting: Family Medicine

## 2014-10-09 NOTE — Telephone Encounter (Signed)
Electronic refill request. Last Filled:    10 tablet 5 RF on 12/24/2012  Please advise.

## 2014-10-10 NOTE — Telephone Encounter (Signed)
Sent!

## 2014-10-11 ENCOUNTER — Telehealth: Payer: Self-pay

## 2014-10-11 MED ORDER — SILDENAFIL CITRATE 20 MG PO TABS: 60.0000 mg | ORAL_TABLET | Freq: Every day | ORAL | Status: DC | PRN

## 2014-10-11 NOTE — Telephone Encounter (Signed)
Pt has new ins and cost of viagra has gone from $100 - $150 to $500 - $600. Pt spoke with ins co and the new ins co does not cover any ED med. Pt said Cialis could be prescribed but had to have dx and pt is still not sure how expensive med would be. Pt request cb with suggestion from Dr Damita Dunnings.

## 2014-10-11 NOTE — Telephone Encounter (Signed)
I printed sildenafil rx.  He can try to shop that around. It's a 20mg  tab, so he can take 3-5 tabs qd prn.

## 2014-10-12 NOTE — Telephone Encounter (Signed)
Patient advised.  Rx left at front desk for pick up. 

## 2014-10-22 ENCOUNTER — Other Ambulatory Visit: Payer: Self-pay | Admitting: Family Medicine

## 2014-11-10 ENCOUNTER — Other Ambulatory Visit (INDEPENDENT_AMBULATORY_CARE_PROVIDER_SITE_OTHER): Payer: BLUE CROSS/BLUE SHIELD

## 2014-11-10 ENCOUNTER — Other Ambulatory Visit: Payer: Self-pay | Admitting: Family Medicine

## 2014-11-10 DIAGNOSIS — I10 Essential (primary) hypertension: Secondary | ICD-10-CM

## 2014-11-10 LAB — LIPID PANEL
Cholesterol: 189 mg/dL (ref 0–200)
HDL: 61.3 mg/dL (ref >39.00)
LDL Cholesterol: 93 mg/dL (ref 0–99)
NONHDL: 127.7
Total CHOL/HDL Ratio: 3
Triglycerides: 172 mg/dL — ABNORMAL HIGH (ref 0.0–149.0)
VLDL: 34.4 mg/dL (ref 0.0–40.0)

## 2014-11-10 LAB — COMPREHENSIVE METABOLIC PANEL
ALK PHOS: 96 U/L (ref 39–117)
ALT: 23 U/L (ref 0–53)
AST: 21 U/L (ref 0–37)
Albumin: 4.5 g/dL (ref 3.5–5.2)
BILIRUBIN TOTAL: 0.5 mg/dL (ref 0.2–1.2)
BUN: 10 mg/dL (ref 6–23)
CO2: 32 mEq/L (ref 19–32)
Calcium: 9.2 mg/dL (ref 8.4–10.5)
Chloride: 102 mEq/L (ref 96–112)
Creatinine, Ser: 1.1 mg/dL (ref 0.40–1.50)
GFR: 76.76 mL/min (ref >60.00)
GLUCOSE: 101 mg/dL — AB (ref 70–99)
Potassium: 4.2 mEq/L (ref 3.5–5.1)
SODIUM: 140 meq/L (ref 135–145)
TOTAL PROTEIN: 7.9 g/dL (ref 6.0–8.3)

## 2014-11-13 ENCOUNTER — Telehealth: Payer: Self-pay | Admitting: Family Medicine

## 2014-11-13 NOTE — Telephone Encounter (Signed)
emmi emailed °

## 2014-11-14 ENCOUNTER — Encounter: Payer: Self-pay | Admitting: Family Medicine

## 2014-11-14 ENCOUNTER — Ambulatory Visit (INDEPENDENT_AMBULATORY_CARE_PROVIDER_SITE_OTHER): Payer: BLUE CROSS/BLUE SHIELD | Admitting: Family Medicine

## 2014-11-14 VITALS — BP 148/90 | HR 70 | Temp 98.6°F | Ht 70.0 in | Wt 230.2 lb

## 2014-11-14 DIAGNOSIS — I10 Essential (primary) hypertension: Secondary | ICD-10-CM

## 2014-11-14 DIAGNOSIS — N529 Male erectile dysfunction, unspecified: Secondary | ICD-10-CM

## 2014-11-14 DIAGNOSIS — Z7189 Other specified counseling: Secondary | ICD-10-CM

## 2014-11-14 DIAGNOSIS — Z23 Encounter for immunization: Secondary | ICD-10-CM

## 2014-11-14 DIAGNOSIS — Z0000 Encounter for general adult medical examination without abnormal findings: Secondary | ICD-10-CM

## 2014-11-14 MED ORDER — AMLODIPINE BESYLATE 5 MG PO TABS: 5.0000 mg | ORAL_TABLET | Freq: Every day | ORAL | Status: DC

## 2014-11-14 MED ORDER — SERTRALINE HCL 50 MG PO TABS: ORAL_TABLET | ORAL | Status: DC

## 2014-11-14 MED ORDER — TADALAFIL 20 MG PO TABS: 10.0000 mg | ORAL_TABLET | ORAL | Status: DC | PRN

## 2014-11-14 MED ORDER — HYDROCHLOROTHIAZIDE 12.5 MG PO CAPS: ORAL_CAPSULE | ORAL | Status: DC

## 2014-11-14 NOTE — Progress Notes (Signed)
Pre visit review using our clinic review tool, if applicable. No additional management support is needed unless otherwise documented below in the visit note.  CPE- See plan.  Routine anticipatory guidance given to patient.  See health maintenance. Tetanus 2016 Flu encouraged for fall 2016.   PNA and shingles not due, d/w pt.   Colon and prostate cancer screening not due.   Living will d/w pt.  Mother designated if patient were incapacitated.  Diet and exercise d/w pt.  D/w pt.  Weight is back up.  "I got off track and missed some weeks of exercise."  Just started back on with exercise.   Hypertension:    Using medication without problems or lightheadedness: yes Chest pain with exertion:no Edema:no Short of breath:no Taking 2.5mg  amlodipine with HCTZ.   ED.  Needs refill, couldn't get sildenafil filled due to cost.    PMH and SH reviewed  Meds, vitals, and allergies reviewed.   ROS: See HPI.  Otherwise negative.    GEN: nad, alert and oriented HEENT: mucous membranes moist NECK: supple w/o LA CV: rrr. PULM: ctab, no inc wob ABD: soft, +bs EXT: no edema SKIN: no acute rash

## 2014-11-14 NOTE — Patient Instructions (Signed)
I would get a flu shot each fall.   Increase to 5mg  of amlodipine.  Goal BP <140/<90.  If your BP stays up on the higher dose after 2 weeks, then notify me.  If you BP goes down with weight loss, then cut back to 1/2 tab of amlodipine.  Take care.  Glad to see you.

## 2014-11-15 DIAGNOSIS — Z7189 Other specified counseling: Secondary | ICD-10-CM | POA: Insufficient documentation

## 2014-11-15 NOTE — Assessment & Plan Note (Signed)
Okay to try cialis, rx given to patient. Routine cautions.

## 2014-11-15 NOTE — Assessment & Plan Note (Signed)
Tetanus 2016 Flu encouraged for fall 2016.   PNA and shingles not due, d/w pt.   Colon and prostate cancer screening not due.   Living will d/w pt.  Mother designated if patient were incapacitated.  Diet and exercise d/w pt.  D/w pt.  Weight is back up.  "I got off track and missed some weeks of exercise."  Just started back on with exercise.

## 2014-11-15 NOTE — Assessment & Plan Note (Signed)
Increase to 5mg  of amlodipine. Goal BP <140/<90. If his BP stays up on the higher dose after 2 weeks, then he'll notify me.  If BP goes down with weight loss, then cut back to 1/2 tab of amlodipine.  He agrees. D/w pt about diet and exercise, with TG and sugar noted.

## 2015-05-30 ENCOUNTER — Ambulatory Visit (INDEPENDENT_AMBULATORY_CARE_PROVIDER_SITE_OTHER): Payer: BLUE CROSS/BLUE SHIELD | Admitting: Family Medicine

## 2015-05-30 ENCOUNTER — Encounter: Payer: Self-pay | Admitting: Family Medicine

## 2015-05-30 VITALS — BP 152/90 | HR 70 | Temp 98.1°F | Wt 227.0 lb

## 2015-05-30 DIAGNOSIS — R1011 Right upper quadrant pain: Secondary | ICD-10-CM

## 2015-05-30 DIAGNOSIS — I10 Essential (primary) hypertension: Secondary | ICD-10-CM

## 2015-05-30 DIAGNOSIS — J029 Acute pharyngitis, unspecified: Secondary | ICD-10-CM

## 2015-05-30 MED ORDER — LORATADINE 10 MG PO TABS: 10.0000 mg | ORAL_TABLET | Freq: Every day | ORAL | Status: DC

## 2015-05-30 NOTE — Patient Instructions (Signed)
Go to the lab on the way out.  We'll contact you with your lab report. Gradually cut back on your weight.  Avoid fatty foods.  See if the abdominal pain is worse after eating (fatty foods may make it worse). Try taking claritin and see if the eye/throat symptoms improve.  Take care.  Glad to see you.

## 2015-05-30 NOTE — Progress Notes (Signed)
Pre visit review using our clinic review tool, if applicable. No additional management support is needed unless otherwise documented below in the visit note.  HTN. BP has been elevated chronically.  Compliant with meds.  No ADE with meds.  Not SOB. No CP.   Months of a ST, "irritated."  No FCNAVD.  Not a severe ST.  No rhinorrhea.  Not much post nasal gtt noted.   Had B episodic eye redness.  He has had more irritation and discharge in the L>R eye in the AM, noted for the last few month.  Not much eye itchiness.   Vision is still good B.  No ear pain.  No cough usually, but some throat clearing.   He doesn't feel poorly.   Hasn't tried anything specifically.    He had noted occ RUQ pain in the last few months.  occ dull ache.  No clear triggers.  Not consistent.  Not noted after eating or with certain meals but he hadn't been tracking that specifically.  Not a severe sx.  Usually lasts about a few hours.  No vomiting, no diarrhea.  No other abd pain.  No FH GB pathology.  No stool changes.  No jaundice.  Urine still looks normal.    PMH and SH reviewed  ROS: See HPI, otherwise noncontributory.  Meds, vitals, and allergies reviewed.   GEN: nad, alert and oriented HEENT: mucous membranes moist, tm wnl B, nasal exam stuffy and mildly irritated.  Mild OP irritation, no ulceration or exudates NECK: supple w/o LA CV: rrr.  PULM: ctab, no inc wob ABD: soft, +bs, minimally ttp in the RUQ w/o rebound. Not ttp o/w.  EXT: no edema SKIN: no acute rash PERRL, conjunctiva and lids WNL B

## 2015-05-31 ENCOUNTER — Other Ambulatory Visit: Payer: Self-pay | Admitting: Family Medicine

## 2015-05-31 ENCOUNTER — Encounter: Payer: Self-pay | Admitting: Family Medicine

## 2015-05-31 DIAGNOSIS — J029 Acute pharyngitis, unspecified: Secondary | ICD-10-CM | POA: Insufficient documentation

## 2015-05-31 DIAGNOSIS — R1011 Right upper quadrant pain: Secondary | ICD-10-CM | POA: Insufficient documentation

## 2015-05-31 LAB — CBC WITH DIFFERENTIAL/PLATELET
Basophils Absolute: 0.1 10*3/uL (ref 0.0–0.1)
Basophils Relative: 0.7 % (ref 0.0–3.0)
Eosinophils Absolute: 0.2 10*3/uL (ref 0.0–0.7)
Eosinophils Relative: 2.9 % (ref 0.0–5.0)
HCT: 45.5 % (ref 39.0–52.0)
Hemoglobin: 15.9 g/dL (ref 13.0–17.0)
LYMPHS ABS: 2.8 10*3/uL (ref 0.7–4.0)
Lymphocytes Relative: 33.7 % (ref 12.0–46.0)
MCHC: 35 g/dL (ref 30.0–36.0)
MCV: 92.3 fl (ref 78.0–100.0)
MONO ABS: 0.6 10*3/uL (ref 0.1–1.0)
Monocytes Relative: 7.6 % (ref 3.0–12.0)
Neutro Abs: 4.5 10*3/uL (ref 1.4–7.7)
Neutrophils Relative %: 55.1 % (ref 43.0–77.0)
Platelets: 217 10*3/uL (ref 150.0–400.0)
RBC: 4.93 Mil/uL (ref 4.22–5.81)
RDW: 12.3 % (ref 11.5–15.5)
WBC: 8.2 10*3/uL (ref 4.0–10.5)

## 2015-05-31 LAB — COMPREHENSIVE METABOLIC PANEL
ALT: 25 U/L (ref 0–53)
AST: 26 U/L (ref 0–37)
Albumin: 4.3 g/dL (ref 3.5–5.2)
Alkaline Phosphatase: 84 U/L (ref 39–117)
BUN: 22 mg/dL (ref 6–23)
CO2: 27 mEq/L (ref 19–32)
Calcium: 9.1 mg/dL (ref 8.4–10.5)
Chloride: 102 mEq/L (ref 96–112)
Creatinine, Ser: 1.07 mg/dL (ref 0.40–1.50)
GFR: 79.06 mL/min (ref >60.00)
GLUCOSE: 81 mg/dL (ref 70–99)
POTASSIUM: 3.7 meq/L (ref 3.5–5.1)
SODIUM: 138 meq/L (ref 135–145)
TOTAL PROTEIN: 7.4 g/dL (ref 6.0–8.3)
Total Bilirubin: 0.4 mg/dL (ref 0.2–1.2)

## 2015-05-31 LAB — HEPATITIS C ANTIBODY: HCV Ab: NEGATIVE

## 2015-05-31 LAB — HEPATITIS B SURFACE ANTIBODY,QUALITATIVE: Hep B S Ab: NEGATIVE

## 2015-05-31 LAB — HEPATITIS B SURFACE ANTIGEN: Hepatitis B Surface Ag: NEGATIVE

## 2015-05-31 LAB — LIPASE: Lipase: 27 U/L (ref 11.0–59.0)

## 2015-05-31 MED ORDER — AMLODIPINE BESYLATE 10 MG PO TABS: 10.0000 mg | ORAL_TABLET | Freq: Every day | ORAL | Status: DC

## 2015-05-31 NOTE — Assessment & Plan Note (Signed)
Likely in combination with eye sx.  Likely irritation/allergic sx.  D/w pt.  Not likely infectious.   Start claritin and f/u prn.  He agrees.

## 2015-05-31 NOTE — Assessment & Plan Note (Signed)
Mild, intermittent.  D/w pt about possible GB source, will have him monitor diet, see AVS.  Check labs today, okay for outpatient f/u.  He agrees.

## 2015-05-31 NOTE — Assessment & Plan Note (Signed)
We can address after I see his labs.  Needs gradual weight loss.  He agrees with plan.  Not a candidate for ACE.  D/w pt. See notes on labs.

## 2015-11-27 ENCOUNTER — Other Ambulatory Visit: Payer: Self-pay | Admitting: Family Medicine

## 2016-01-21 ENCOUNTER — Other Ambulatory Visit: Payer: Self-pay | Admitting: Family Medicine

## 2016-01-21 DIAGNOSIS — I10 Essential (primary) hypertension: Secondary | ICD-10-CM

## 2016-01-25 ENCOUNTER — Other Ambulatory Visit (INDEPENDENT_AMBULATORY_CARE_PROVIDER_SITE_OTHER): Payer: BLUE CROSS/BLUE SHIELD

## 2016-01-25 DIAGNOSIS — I10 Essential (primary) hypertension: Secondary | ICD-10-CM

## 2016-01-25 LAB — LIPID PANEL
CHOL/HDL RATIO: 4
CHOLESTEROL: 189 mg/dL (ref 0–200)
HDL: 45.7 mg/dL (ref >39.00)
LDL CALC: 114 mg/dL — AB (ref 0–99)
NonHDL: 143.35
TRIGLYCERIDES: 147 mg/dL (ref 0.0–149.0)
VLDL: 29.4 mg/dL (ref 0.0–40.0)

## 2016-01-25 LAB — COMPREHENSIVE METABOLIC PANEL
ALBUMIN: 4.5 g/dL (ref 3.5–5.2)
ALT: 29 U/L (ref 0–53)
AST: 23 U/L (ref 0–37)
Alkaline Phosphatase: 88 U/L (ref 39–117)
BUN: 16 mg/dL (ref 6–23)
CALCIUM: 9.6 mg/dL (ref 8.4–10.5)
CHLORIDE: 103 meq/L (ref 96–112)
CO2: 28 meq/L (ref 19–32)
Creatinine, Ser: 1 mg/dL (ref 0.40–1.50)
GFR: 85.24 mL/min (ref >60.00)
Glucose, Bld: 105 mg/dL — ABNORMAL HIGH (ref 70–99)
POTASSIUM: 3.9 meq/L (ref 3.5–5.1)
Sodium: 139 mEq/L (ref 135–145)
Total Bilirubin: 0.8 mg/dL (ref 0.2–1.2)
Total Protein: 7.4 g/dL (ref 6.0–8.3)

## 2016-02-01 ENCOUNTER — Encounter: Payer: Self-pay | Admitting: Family Medicine

## 2016-02-01 ENCOUNTER — Ambulatory Visit (INDEPENDENT_AMBULATORY_CARE_PROVIDER_SITE_OTHER): Payer: BLUE CROSS/BLUE SHIELD | Admitting: Family Medicine

## 2016-02-01 VITALS — BP 142/84 | HR 74 | Temp 98.3°F | Ht 70.0 in | Wt 241.2 lb

## 2016-02-01 DIAGNOSIS — Z0000 Encounter for general adult medical examination without abnormal findings: Secondary | ICD-10-CM

## 2016-02-01 DIAGNOSIS — R0683 Snoring: Secondary | ICD-10-CM

## 2016-02-01 DIAGNOSIS — F32A Depression, unspecified: Secondary | ICD-10-CM

## 2016-02-01 DIAGNOSIS — I10 Essential (primary) hypertension: Secondary | ICD-10-CM

## 2016-02-01 DIAGNOSIS — F329 Major depressive disorder, single episode, unspecified: Secondary | ICD-10-CM

## 2016-02-01 MED ORDER — AMLODIPINE BESYLATE 10 MG PO TABS: 10.0000 mg | ORAL_TABLET | Freq: Every day | ORAL | Status: DC

## 2016-02-01 MED ORDER — SERTRALINE HCL 50 MG PO TABS: ORAL_TABLET | ORAL | Status: DC

## 2016-02-01 MED ORDER — HYDROCHLOROTHIAZIDE 12.5 MG PO CAPS: ORAL_CAPSULE | ORAL | Status: DC

## 2016-02-01 NOTE — Progress Notes (Signed)
Pre visit review using our clinic review tool, if applicable. No additional management support is needed unless otherwise documented below in the visit note.  CPE- See plan.  Routine anticipatory guidance given to patient.  See health maintenance. Tetanus 2016 Flu encouraged for fall 2016 PNA and shingles not due, d/w pt Colon and prostate cancer screening not due. Living will d/w pt. Mother designated if patient were incapacitated.   Diet and exercise d/w pt. D/w pt. Weight is back up. Encouraged.   Hypertension:    Using medication without problems or lightheadedness: yes Chest pain with exertion:no Edema:no Short of breath:no Labs d/w pt.   Mood is good with current SSRI.  Didn't tolerate full cessation prev.  No SI/HI.  He wants to continue as is and that is reasonable.   Eye redness, B, hasn't been on claritin frequently.  D/w pt about getting an eye exam for routine check.   Wanted moles on back checked.    Snoring.  Celesta Gentile noted him stopping breathing.  He doesn't wake up gasping for air.  D/w pt about OSA eval.  He was still snoring at lower weight.    PMH and SH reviewed  Meds, vitals, and allergies reviewed.   ROS: Per HPI.  Unless specifically indicated otherwise in HPI, the patient denies:  General: fever. Eyes: acute vision changes ENT: sore throat Cardiovascular: chest pain Respiratory: SOB GI: vomiting GU: dysuria Musculoskeletal: acute back pain Derm: acute rash Neuro: acute motor dysfunction Psych: worsening mood Endocrine: polydipsia Heme: bleeding Allergy: hayfever  GEN: nad, alert and oriented HEENT: mucous membranes moist NECK: supple w/o LA CV: rrr. PULM: ctab, no inc wob ABD: soft, +bs EXT: no edema SKIN: no acute rash, benign nevi noted but small irritated SK noted on the L forearm (dw pt about options, he opts for liq N2 tx x3 at OV, no complications, tolerated well, routine instructions d/w pt)

## 2016-02-01 NOTE — Patient Instructions (Addendum)
Scott Franco will call about your referral. Call about an eye exam.   Diet and exercise.  Take care.  Glad to see you.

## 2016-02-03 DIAGNOSIS — R0683 Snoring: Secondary | ICD-10-CM | POA: Insufficient documentation

## 2016-02-03 NOTE — Assessment & Plan Note (Signed)
Tetanus 2016  Flu encouraged for fall 2016  PNA and shingles not due, d/w pt  Colon and prostate cancer screening not due.  Living will d/w pt. Mother designated if patient were incapacitated.  Diet and exercise d/w pt. D/w pt. Weight is back up. Encouraged.  See above re: SK tx, f/u prn.  He agrees.

## 2016-02-03 NOTE — Assessment & Plan Note (Signed)
Concern for OSA, d/w pt.  Refer to pulm.  He agrees.

## 2016-02-03 NOTE — Assessment & Plan Note (Signed)
Stable, continue as is.  Okay for outpatient f/u.

## 2016-02-03 NOTE — Assessment & Plan Note (Signed)
D/w pt about diet and exercise, needs diet and weight loss more than med change, he agrees.  Labs d/w pt.

## 2016-02-19 ENCOUNTER — Ambulatory Visit (INDEPENDENT_AMBULATORY_CARE_PROVIDER_SITE_OTHER): Payer: BLUE CROSS/BLUE SHIELD | Admitting: Internal Medicine

## 2016-02-19 ENCOUNTER — Encounter: Payer: Self-pay | Admitting: Internal Medicine

## 2016-02-19 VITALS — BP 142/88 | HR 56 | Ht 70.0 in | Wt 242.0 lb

## 2016-02-19 DIAGNOSIS — G4719 Other hypersomnia: Secondary | ICD-10-CM | POA: Diagnosis not present

## 2016-02-19 NOTE — Patient Instructions (Signed)
Sleep Apnea  Sleep apnea is a sleep disorder characterized by abnormal pauses in breathing while you sleep. When your breathing pauses, the level of oxygen in your blood decreases. This causes you to move out of deep sleep and into light sleep. As a result, your quality of sleep is poor, and the system that carries your blood throughout your body (cardiovascular system) experiences stress. If sleep apnea remains untreated, the following conditions can develop:  High blood pressure (hypertension).  Coronary artery disease.  Inability to achieve or maintain an erection (impotence).  Impairment of your thought process (cognitive dysfunction). There are three types of sleep apnea: 1. Obstructive sleep apnea--Pauses in breathing during sleep because of a blocked airway. 2. Central sleep apnea--Pauses in breathing during sleep because the area of the brain that controls your breathing does not send the correct signals to the muscles that control breathing. 3. Mixed sleep apnea--A combination of both obstructive and central sleep apnea. RISK FACTORS The following risk factors can increase your risk of developing sleep apnea:  Being overweight.  Smoking.  Having narrow passages in your nose and throat.  Being of older age.  Being male.  Alcohol use.  Sedative and tranquilizer use.  Ethnicity. Among individuals younger than 35 years, African Americans are at increased risk of sleep apnea. SYMPTOMS   Difficulty staying asleep.  Daytime sleepiness and fatigue.  Loss of energy.  Irritability.  Loud, heavy snoring.  Morning headaches.  Trouble concentrating.  Forgetfulness.  Decreased interest in sex.  Unexplained sleepiness. DIAGNOSIS  In order to diagnose sleep apnea, your caregiver will perform a physical examination. A sleep study done in the comfort of your own home may be appropriate if you are otherwise healthy. Your caregiver may also recommend that you spend the  night in a sleep lab. In the sleep lab, several monitors record information about your heart, lungs, and brain while you sleep. Your leg and arm movements and blood oxygen level are also recorded. TREATMENT The following actions may help to resolve mild sleep apnea:  Sleeping on your side.   Using a decongestant if you have nasal congestion.   Avoiding the use of depressants, including alcohol, sedatives, and narcotics.   Losing weight and modifying your diet if you are overweight. There also are devices and treatments to help open your airway:  Oral appliances. These are custom-made mouthpieces that shift your lower jaw forward and slightly open your bite. This opens your airway.  Devices that create positive airway pressure. This positive pressure "splints" your airway open to help you breathe better during sleep. The following devices create positive airway pressure:  Continuous positive airway pressure (CPAP) device. The CPAP device creates a continuous level of air pressure with an air pump. The air is delivered to your airway through a mask while you sleep. This continuous pressure keeps your airway open.  Nasal expiratory positive airway pressure (EPAP) device. The EPAP device creates positive air pressure as you exhale. The device consists of single-use valves, which are inserted into each nostril and held in place by adhesive. The valves create very little resistance when you inhale but create much more resistance when you exhale. That increased resistance creates the positive airway pressure. This positive pressure while you exhale keeps your airway open, making it easier to breath when you inhale again.  Bilevel positive airway pressure (BPAP) device. The BPAP device is used mainly in patients with central sleep apnea. This device is similar to the CPAP device because   it also uses an air pump to deliver continuous air pressure through a mask. However, with the BPAP machine, the  pressure is set at two different levels. The pressure when you exhale is lower than the pressure when you inhale.  Surgery. Typically, surgery is only done if you cannot comply with less invasive treatments or if the less invasive treatments do not improve your condition. Surgery involves removing excess tissue in your airway to create a wider passage way.   This information is not intended to replace advice given to you by your health care provider. Make sure you discuss any questions you have with your health care provider.   Document Released: 08/22/2002 Document Revised: 09/22/2014 Document Reviewed: 01/08/2012 Elsevier Interactive Patient Education 2016 Elsevier Inc.   

## 2016-02-19 NOTE — Progress Notes (Signed)
Scott Franco Pulmonary Medicine Consultation      Date: 02/19/2016,   MRN# [id number] Scott Franco 09/29/1970 Code Status:  Code Status History    This patient does not have a recorded code status. Please follow your organizational policy for patients in this situation.     Hosp day:@LENGTHOFSTAYDAYS @ Referring MD: @ATDPROV @     PCP:      AdmissionWeight: 242 lb (109.77 kg)                 CurrentWeight: 242 lb (109.77 kg) Scott Franco is a 45 y.o. old male seen in consultation for sleep problems at the request of Dr Damita Dunnings.     CHIEF COMPLAINT:   Problems breathing at night   HISTORY OF PRESENT ILLNESS   45 yo white male seen today for problems sleeping, Fiance states that patient stops breathing at night, chokes in sleep and gasps for air in his sleep for last 4 months. Patient states that he wakes up 4-5 times per night for unknown reasons, patient wakes up fatigue and tired in the morning, daytime sleepiness and fatigue Patient is tired through out  the day, he has been known to snore for many years  Patient has gained 35 pounds in last year due to lack of exercise His neck size is 15.5 inches Patient was smoker 1 ppd for 15 years quit 12/25/1997 when father died of lung cancer   PAST MEDICAL HISTORY   Past Medical History  Diagnosis Date  . Depression   . Hypertension   . GERD (gastroesophageal reflux disease)   . Hyperlipidemia      SURGICAL HISTORY   Past Surgical History  Procedure Laterality Date  . Vasectomy    . Adenoidectomy       FAMILY HISTORY   Family History  Problem Relation Age of Onset  . Hypertension Mother   . Cancer Father     Lung CA, smoker  . Hypertension Father   . Prostate cancer Neg Hx   . Colon cancer Neg Hx      SOCIAL HISTORY   Social History  Substance Use Topics  . Smoking status: Former Smoker -- 0.50 packs/day for 14 years    Types: Cigarettes    Quit date: 09/15/1997  . Smokeless tobacco: Never  Used  . Alcohol Use: No     Comment: minimal as of 12/26/11, prev more     MEDICATIONS    Home Medication:  Current Outpatient Rx  Name  Route  Sig  Dispense  Refill  . amLODipine (NORVASC) 10 MG tablet   Oral   Take 1 tablet (10 mg total) by mouth daily.   90 tablet   3   . hydrochlorothiazide (MICROZIDE) 12.5 MG capsule      TAKE 1 CAPSULE (12.5 MG TOTAL) BY MOUTH DAILY.   90 capsule   3   . loratadine (CLARITIN) 10 MG tablet   Oral   Take 1 tablet (10 mg total) by mouth daily.         . sertraline (ZOLOFT) 50 MG tablet      TAKE 1 TABLET (50 MG TOTAL) BY MOUTH DAILY.   90 tablet   3   . tadalafil (CIALIS) 20 MG tablet   Oral   Take 0.5-1 tablets (10-20 mg total) by mouth every other day as needed for erectile dysfunction.   5 tablet   11     Current Medication:  Current outpatient prescriptions:  .  amLODipine (  NORVASC) 10 MG tablet, Take 1 tablet (10 mg total) by mouth daily., Disp: 90 tablet, Rfl: 3 .  hydrochlorothiazide (MICROZIDE) 12.5 MG capsule, TAKE 1 CAPSULE (12.5 MG TOTAL) BY MOUTH DAILY., Disp: 90 capsule, Rfl: 3 .  loratadine (CLARITIN) 10 MG tablet, Take 1 tablet (10 mg total) by mouth daily., Disp: , Rfl:  .  sertraline (ZOLOFT) 50 MG tablet, TAKE 1 TABLET (50 MG TOTAL) BY MOUTH DAILY., Disp: 90 tablet, Rfl: 3 .  tadalafil (CIALIS) 20 MG tablet, Take 0.5-1 tablets (10-20 mg total) by mouth every other day as needed for erectile dysfunction., Disp: 5 tablet, Rfl: 11    ALLERGIES   Ace inhibitors and Angiotensin receptor blockers     REVIEW OF SYSTEMS   Review of Systems  Constitutional: Positive for malaise/fatigue. Negative for fever, chills, weight loss and diaphoresis.  HENT: Negative for congestion and hearing loss.   Eyes: Negative for blurred vision and double vision.  Respiratory: Negative for cough, hemoptysis, sputum production, shortness of breath and wheezing.   Cardiovascular: Negative for chest pain, palpitations, orthopnea  and leg swelling.  Gastrointestinal: Negative for heartburn, nausea, vomiting and abdominal pain.  Genitourinary: Negative for flank pain.  Musculoskeletal: Negative for myalgias and back pain.  Skin: Negative for rash.  Neurological: Negative for dizziness, weakness and headaches.  Endo/Heme/Allergies: Does not bruise/bleed easily.  Psychiatric/Behavioral: Negative for depression. The patient is not nervous/anxious.   All other systems reviewed and are negative.    VS: BP 142/88 mmHg  Pulse 56  Ht 5\' 10"  (1.778 m)  Wt 242 lb (109.77 kg)  BMI 34.72 kg/m2  SpO2 98%     PHYSICAL EXAM  Physical Exam  Constitutional: He appears well-developed and well-nourished. No distress.  HENT:  Head: Normocephalic and atraumatic.  Mouth/Throat: No oropharyngeal exudate.  Eyes: EOM are normal. Pupils are equal, round, and reactive to light. No scleral icterus.  Neck: Normal range of motion. Neck supple.  Cardiovascular: Normal rate and regular rhythm.   No murmur heard. Pulmonary/Chest: No stridor. No respiratory distress. He has no wheezes. He has no rales.  Abdominal: Soft. Bowel sounds are normal.  Musculoskeletal: Normal range of motion. He exhibits no edema.  Neurological: No cranial nerve deficit.  Skin: Skin is warm. No rash noted.  Psychiatric: He has a normal mood and affect.        ASSESSMENT/PLAN   45 yo white male with signs and symptoms of sleep apnea with snoring, breathing difficulty and weight gain  Patient needs Sleep study with ONO ASAP Follow up after tests completed   The Patient requires high complexity decision making for assessment and support, frequent evaluation and titration of therapies, application of advanced monitoring technologies and extensive interpretation of multiple databases.  Patient/Family are satisfied with Plan of action and management. All questions answered  Corrin Parker, M.D.  [facility]  Medical  Director [place] Director [facility] Cardio-Pulmonary Department

## 2016-04-14 ENCOUNTER — Telehealth: Payer: Self-pay | Admitting: Internal Medicine

## 2016-04-14 NOTE — Telephone Encounter (Signed)
ONO was ordered for the patient at the time of his OV. APS has contacted patient and spoke with patient multiple times. Per APS "pt has always wanted to wait to schedule. Last reason was to get in contact with his insurance company". Just wanted you to be aware that this has not been completed as of yet. Rhonda J Cobb

## 2016-09-19 ENCOUNTER — Ambulatory Visit: Payer: Self-pay | Admitting: Family Medicine

## 2016-09-22 ENCOUNTER — Ambulatory Visit: Payer: Self-pay | Admitting: Family Medicine

## 2016-12-11 ENCOUNTER — Telehealth: Payer: Self-pay | Admitting: Family Medicine

## 2016-12-11 NOTE — Telephone Encounter (Signed)
Patient Name: Scott Franco  Gender: Male  DOB: Sep 01, 1971   Age: 46 Y 34 M 4 D  Return Phone Number: [phone] (Primary)  Address:   City/State/Zip: [place] [address]   Client [facility] - Client  Client Site [facility] - Day  Physician Renford Dills - MD  Contact Type Call  Who Is Calling Patient / Member / Family / Caregiver  Call Type Triage / Clinical  Relationship To Patient Self  Return Phone Number [phone] (Primary)  Chief Complaint Leg Swelling And Edema  Reason for Call Symptomatic / Request for [name] states he hit his leg on a door and is swollen with a bruise. Baseball sized knot on shin.   Additional Comment [facility]  PreDisposition Did not know what to do  Translation No   Nurse Assessment  Nurse: Genoveva Ill, RN, Lattie Haw Date/Time (Eastern Time): 12/11/2016 4:39:57 PM  Confirm and document reason for call. If symptomatic, describe symptoms. ---Caller states he hit his leg the corner of a truck door and it is swollen with a bruise. Baseball sized knot on shin.  Does the patient have any new or worsening symptoms? ---Yes  Will a triage be completed? ---Yes  Related visit to physician within the last 2 weeks? ---N/A  Does the PT have any chronic conditions? (i.e. diabetes, asthma, etc.) ---Yes  List chronic conditions. ---HTN  Is this a behavioral health or substance abuse call? ---No     Guidelines      Guideline Title Affirmed Question Affirmed Notes Nurse Date/Time (Eastern Time)  Leg Injury [1] Large swelling or bruise AND [2] size > palm of person's hand  Burress, RN, Lattie Haw 12/11/2016 4:41:58 PM   Disp. Time Eilene Ghazi Time) Disposition Final User          12/11/2016 4:54:37 PM See Physician within 24 Hours Yes Burress, RN, Leland Johns Understands: Yes  Disagree/Comply: Comply     Care Advice Given Per Guideline      SEE PHYSICIAN WITHIN 24 HOURS: *  IF OFFICE WILL BE OPEN: You need to be seen within the next 24 hours. Call your doctor when the office opens, and make an appointment. LOCAL COLD: For bruises or swelling, apply a cold pack or an ice bag (wrapped in a moist towel) to the area for 20 minutes per hour. Repeat for 4 consecutive hours. (Reason: reduce the bleeding and pain) * For pain relief, take acetaminophen, ibuprofen, or naproxen. * Before taking any medicine, read all the instructions on the package. * Take 400 mg (two 200 mg pills) by mouth every 6 hours as needed. * Another choice is to take 600 mg (three 200 mg pills) by mouth every 8 hours as needed. CALL BACK IF: * Pain becomes severe * You become worse. CARE ADVICE given per Leg Injury (Adult) guideline.   Comments  User: Margaretha Sheffield, RN Date/Time Eilene Ghazi Time): 12/11/2016 4:54:19 PM  No appt available for today..pt states he is going to ER, because he has had a blood clot before, just below injury   Referrals  GO TO FACILITY OTHER - SPECIFY

## 2016-12-22 ENCOUNTER — Telehealth: Payer: Self-pay | Admitting: Family Medicine

## 2016-12-22 NOTE — Telephone Encounter (Signed)
[facility]  Patient Name: Scott Franco  DOB: 11-May-1971    Initial Comment Caller states has an appointment at 9:30 tomorrow. Has a hematoma. Blood is pooling in his leg causing his leg to swell. Wants to know if Dr. Damita Dunnings can take care of that at the appointment tomorrow or if he will be referring him to someone else. Would like to go ahead and get the referral today if that is what he is going to do.    Nurse Assessment  Nurse: Laqueta Due, RN, Metallurgist (Eastern Time): 12/22/2016 4:10:36 PM  Confirm and document reason for call. If symptomatic, describe symptoms. ---Caller states has appointment tomorrow at 9:30, hematoma in leg causing swelling from hitting shin on car door. Went to urgent care thursday evening dr looked at it, keep ice on it, elevate it, take ibuprofen. Swelling had subsided over weekend, went to work today and swelling increased. swelling starting to move down leg to foot involves calf all the way down to foot (left). Big knot on shin where hit door, bruising at base of foot  Does the patient have any new or worsening symptoms? ---Yes  Will a triage be completed? ---Yes  Related visit to physician within the last 2 weeks? ---Yes  Does the PT have any chronic conditions? (i.e. diabetes, asthma, etc.) ---No  Is this a behavioral health or substance abuse call? ---No     Guidelines    Guideline Title Affirmed Question Affirmed Notes  Leg Injury [1] Large swelling or bruise AND [2] size > palm of person's hand    Final Disposition User   See Physician within [place], RN, Water quality scientist wants to know if drainage of hematoma is something that office can do or if he would need to be seen elsewhere to get that done. Advised caller I would call the office and see if office performs that procedure. Office spoke with Dr. Damita Dunnings who stated that he wouldn't do the procedure there, if excrutiating pain, advise to  go to ER. Called pt back informing pt of Dr's advise. Caller verbalized understanding.   Referrals  REFERRED TO PCP OFFICE   Disagree/Comply: Comply

## 2016-12-23 ENCOUNTER — Ambulatory Visit: Payer: BLUE CROSS/BLUE SHIELD | Admitting: Family Medicine

## 2016-12-23 NOTE — Telephone Encounter (Signed)
Noted. Thanks.

## 2016-12-23 NOTE — Telephone Encounter (Signed)
I spoke with pt and he has appt with Percell Miller and Noemi Chapel to take care of leg. FYI to Dr Damita Dunnings.-

## 2017-03-05 DIAGNOSIS — J3089 Other allergic rhinitis: Secondary | ICD-10-CM | POA: Diagnosis not present

## 2017-03-19 ENCOUNTER — Other Ambulatory Visit: Payer: Self-pay | Admitting: Family Medicine

## 2017-03-29 ENCOUNTER — Other Ambulatory Visit: Payer: Self-pay | Admitting: Family Medicine

## 2017-04-07 ENCOUNTER — Other Ambulatory Visit: Payer: Self-pay | Admitting: Family Medicine

## 2017-04-07 ENCOUNTER — Other Ambulatory Visit (INDEPENDENT_AMBULATORY_CARE_PROVIDER_SITE_OTHER): Payer: BLUE CROSS/BLUE SHIELD

## 2017-04-07 DIAGNOSIS — I10 Essential (primary) hypertension: Secondary | ICD-10-CM

## 2017-04-07 LAB — COMPREHENSIVE METABOLIC PANEL
ALT: 39 U/L (ref 0–53)
AST: 28 U/L (ref 0–37)
Albumin: 4.4 g/dL (ref 3.5–5.2)
Alkaline Phosphatase: 74 U/L (ref 39–117)
BILIRUBIN TOTAL: 0.6 mg/dL (ref 0.2–1.2)
BUN: 19 mg/dL (ref 6–23)
CALCIUM: 9.9 mg/dL (ref 8.4–10.5)
CO2: 30 meq/L (ref 19–32)
CREATININE: 1.15 mg/dL (ref 0.40–1.50)
Chloride: 101 mEq/L (ref 96–112)
GFR: 72.17 mL/min (ref >60.00)
GLUCOSE: 110 mg/dL — AB (ref 70–99)
Potassium: 4.4 mEq/L (ref 3.5–5.1)
Sodium: 139 mEq/L (ref 135–145)
TOTAL PROTEIN: 7.3 g/dL (ref 6.0–8.3)

## 2017-04-07 LAB — LIPID PANEL
CHOL/HDL RATIO: 4
Cholesterol: 186 mg/dL (ref 0–200)
HDL: 43.5 mg/dL (ref >39.00)
LDL Cholesterol: 121 mg/dL — ABNORMAL HIGH (ref 0–99)
NONHDL: 142.11
Triglycerides: 104 mg/dL (ref 0.0–149.0)
VLDL: 20.8 mg/dL (ref 0.0–40.0)

## 2017-04-08 ENCOUNTER — Encounter: Payer: BLUE CROSS/BLUE SHIELD | Admitting: Family Medicine

## 2017-04-21 ENCOUNTER — Encounter: Payer: Self-pay | Admitting: Family Medicine

## 2017-04-21 ENCOUNTER — Ambulatory Visit (INDEPENDENT_AMBULATORY_CARE_PROVIDER_SITE_OTHER): Payer: BLUE CROSS/BLUE SHIELD | Admitting: Family Medicine

## 2017-04-21 VITALS — BP 138/80 | HR 79 | Temp 98.6°F | Ht 70.0 in | Wt 238.8 lb

## 2017-04-21 DIAGNOSIS — Z0000 Encounter for general adult medical examination without abnormal findings: Secondary | ICD-10-CM | POA: Diagnosis not present

## 2017-04-21 DIAGNOSIS — I10 Essential (primary) hypertension: Secondary | ICD-10-CM

## 2017-04-21 DIAGNOSIS — Z659 Problem related to unspecified psychosocial circumstances: Secondary | ICD-10-CM

## 2017-04-21 DIAGNOSIS — Z7189 Other specified counseling: Secondary | ICD-10-CM

## 2017-04-21 DIAGNOSIS — R739 Hyperglycemia, unspecified: Secondary | ICD-10-CM

## 2017-04-21 MED ORDER — HYDROCHLOROTHIAZIDE 12.5 MG PO CAPS: ORAL_CAPSULE | ORAL | 3 refills | Status: DC

## 2017-04-21 MED ORDER — AMLODIPINE BESYLATE 10 MG PO TABS: 10.0000 mg | ORAL_TABLET | Freq: Every day | ORAL | 3 refills | Status: DC

## 2017-04-21 MED ORDER — LORATADINE 10 MG PO TABS: 10.0000 mg | ORAL_TABLET | Freq: Every day | ORAL | Status: DC | PRN

## 2017-04-21 NOTE — Progress Notes (Signed)
CPE- See plan.  Routine anticipatory guidance given to patient.  See health maintenance.  The possibility exists that previously documented standard health maintenance information may have been brought forward from a previous encounter into this note.  If needed, that same information has been updated to reflect the current situation based on today's encounter.    Tetanus 2016  Flu encouraged for fall PNA and shingles not due, d/w pt   Colon and prostate cancer screening not due.   Living will d/w pt. Mother designated if patient were incapacitated.  Diet and exercise d/w pt. D/w pt. Episodic with exercise, 1-2 time a week and then has trouble with adherence.    He is interested in going to counseling for social stressors. No SI/HI.  D/w pt.    Hypertension:    Using medication without problems or lightheadedness: yes Chest pain with exertion:no Edema:no Short of breath:no Labs d/w pt.   PMH and SH reviewed  Meds, vitals, and allergies reviewed.   ROS: Per HPI.  Unless specifically indicated otherwise in HPI, the patient denies:  General: fever. Eyes: acute vision changes ENT: sore throat Cardiovascular: chest pain Respiratory: SOB GI: vomiting GU: dysuria Musculoskeletal: acute back pain Derm: acute rash Neuro: acute motor dysfunction Psych: worsening mood Endocrine: polydipsia Heme: bleeding Allergy: hayfever  GEN: nad, alert and oriented HEENT: mucous membranes moist NECK: supple w/o LA CV: rrr. PULM: ctab, no inc wob ABD: soft, +bs EXT: no edema SKIN: no acute rash

## 2017-04-21 NOTE — Patient Instructions (Addendum)
Stop up front and ask about your referral. Thank you for your effort.  Take care.  Glad to see you.  Update me as needed.  I would get a flu shot each fall.

## 2017-04-22 DIAGNOSIS — Z659 Problem related to unspecified psychosocial circumstances: Secondary | ICD-10-CM | POA: Insufficient documentation

## 2017-04-22 NOTE — Assessment & Plan Note (Signed)
No change in meds. Continue work on diet and exercise. This should help with his sugar and lipids and overall cardiovascular health. He agrees. Labs discussed with patient. Update me as needed.

## 2017-04-22 NOTE — Assessment & Plan Note (Signed)
Refer for counseling. Okay for outpatient follow-up. He agrees.

## 2017-04-22 NOTE — Assessment & Plan Note (Signed)
Tetanus 2016  Flu encouraged for fall PNA and shingles not due, d/w pt   Colon and prostate cancer screening not due.   Living will d/w pt. Mother designated if patient were incapacitated.  Diet and exercise d/w pt. D/w pt. Episodic with exercise, 1-2 time a week and then has trouble with adherence.

## 2017-04-22 NOTE — Assessment & Plan Note (Signed)
Living will d/w pt. Mother designated if patient were incapacitated.  

## 2018-04-30 ENCOUNTER — Encounter: Payer: BLUE CROSS/BLUE SHIELD | Admitting: Family Medicine

## 2018-07-01 ENCOUNTER — Other Ambulatory Visit: Payer: Self-pay | Admitting: Family Medicine

## 2018-07-01 NOTE — Telephone Encounter (Signed)
Last OV 04/2017 and no upcoming appt. pls advise

## 2018-07-02 NOTE — Telephone Encounter (Signed)
Called and spoke with patient informing him that appointment is needed for further refills. Understanding verbalized pateint will call and schedule an appointment.

## 2018-07-02 NOTE — Telephone Encounter (Signed)
Needs CPE scheduled.  Thanks.  Sent.

## 2018-08-15 ENCOUNTER — Other Ambulatory Visit: Payer: Self-pay | Admitting: Family Medicine

## 2018-08-15 DIAGNOSIS — I10 Essential (primary) hypertension: Secondary | ICD-10-CM

## 2018-08-16 ENCOUNTER — Other Ambulatory Visit (INDEPENDENT_AMBULATORY_CARE_PROVIDER_SITE_OTHER): Payer: BLUE CROSS/BLUE SHIELD

## 2018-08-16 DIAGNOSIS — I10 Essential (primary) hypertension: Secondary | ICD-10-CM

## 2018-08-16 LAB — COMPREHENSIVE METABOLIC PANEL
ALBUMIN: 4.7 g/dL (ref 3.5–5.2)
ALK PHOS: 71 U/L (ref 39–117)
ALT: 19 U/L (ref 0–53)
AST: 16 U/L (ref 0–37)
BILIRUBIN TOTAL: 0.7 mg/dL (ref 0.2–1.2)
BUN: 20 mg/dL (ref 6–23)
CALCIUM: 9.8 mg/dL (ref 8.4–10.5)
CO2: 30 meq/L (ref 19–32)
CREATININE: 1.23 mg/dL (ref 0.40–1.50)
Chloride: 100 mEq/L (ref 96–112)
GFR: 66.4 mL/min (ref >60.00)
Glucose, Bld: 113 mg/dL — ABNORMAL HIGH (ref 70–99)
Potassium: 4.3 mEq/L (ref 3.5–5.1)
Sodium: 138 mEq/L (ref 135–145)
TOTAL PROTEIN: 7.7 g/dL (ref 6.0–8.3)

## 2018-08-16 LAB — LIPID PANEL
CHOLESTEROL: 182 mg/dL (ref 0–200)
HDL: 44.3 mg/dL (ref >39.00)
LDL Cholesterol: 125 mg/dL — ABNORMAL HIGH (ref 0–99)
NonHDL: 137.36
TRIGLYCERIDES: 63 mg/dL (ref 0.0–149.0)
Total CHOL/HDL Ratio: 4
VLDL: 12.6 mg/dL (ref 0.0–40.0)

## 2018-08-17 ENCOUNTER — Encounter: Payer: Self-pay | Admitting: Family Medicine

## 2018-08-17 ENCOUNTER — Ambulatory Visit (INDEPENDENT_AMBULATORY_CARE_PROVIDER_SITE_OTHER): Payer: BLUE CROSS/BLUE SHIELD | Admitting: Family Medicine

## 2018-08-17 VITALS — BP 102/78 | HR 61 | Temp 98.6°F | Ht 70.0 in | Wt 211.5 lb

## 2018-08-17 DIAGNOSIS — I10 Essential (primary) hypertension: Secondary | ICD-10-CM

## 2018-08-17 DIAGNOSIS — Z0000 Encounter for general adult medical examination without abnormal findings: Secondary | ICD-10-CM | POA: Diagnosis not present

## 2018-08-17 DIAGNOSIS — Z7189 Other specified counseling: Secondary | ICD-10-CM

## 2018-08-17 MED ORDER — AMLODIPINE BESYLATE 10 MG PO TABS: 10.0000 mg | ORAL_TABLET | Freq: Every day | ORAL | 3 refills | Status: DC

## 2018-08-17 MED ORDER — HYDROCHLOROTHIAZIDE 12.5 MG PO CAPS: ORAL_CAPSULE | ORAL | 3 refills | Status: DC

## 2018-08-17 NOTE — Patient Instructions (Addendum)
If you get lightheaded, then stop HCTZ.  Update me as needed.   It looks like benign hemosiderin staining on the leg.   I would get a flu shot each fall.   Take care.  Glad to see you.

## 2018-08-17 NOTE — Progress Notes (Signed)
CPE- See plan.  Routine anticipatory guidance given to patient.  See health maintenance.  The possibility exists that previously documented standard health maintenance information may have been brought forward from a previous encounter into this note.  If needed, that same information has been updated to reflect the current situation based on today's encounter.    Tetanus 2016  Flu encouraged for fall. PNA and shingles not due, d/w pt. Colon and prostate cancer screening not due. Living will d/w pt. Mother designated if patient were incapacitated.  Diet and exercise d/w pt.  Intentional weight loss noted with diet and exercise.   Hypertension:    Using medication without problems or lightheadedness:  yes Chest pain with exertion:no Edema:no Short of breath:no  He is seeing a counselor with his youngest son and that is helpful.  We talked about his divorce history.  His main goal is trying to maintain a healthy environment for his children and he deserves credit for that.  Discussed.  PMH and SH reviewed  Meds, vitals, and allergies reviewed.   ROS: Per HPI.  Unless specifically indicated otherwise in HPI, the patient denies:  General: fever. Eyes: acute vision changes ENT: sore throat Cardiovascular: chest pain Respiratory: SOB GI: vomiting GU: dysuria Musculoskeletal: acute back pain Derm: acute rash Neuro: acute motor dysfunction Psych: worsening mood Endocrine: polydipsia Heme: bleeding Allergy: hayfever  GEN: nad, alert and oriented HEENT: mucous membranes moist NECK: supple w/o LA CV: rrr. 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

## 2018-08-18 NOTE — Assessment & Plan Note (Signed)
Living will d/w pt. Mother designated if patient were incapacitated.  

## 2018-08-18 NOTE — Assessment & Plan Note (Signed)
Tetanus 2016  Flu encouraged for fall. PNA and shingles not due, d/w pt. Colon and prostate cancer screening not due. Living will d/w pt. Mother designated if patient were incapacitated.  Diet and exercise d/w pt.  Intentional weight loss noted with diet and exercise.

## 2018-08-18 NOTE — Assessment & Plan Note (Signed)
Intentional weight loss noted.  Continue as is.  Continue work on diet and exercise.  If he does get lightheaded then I want him to stop his hydrochlorothiazide.  He can check his blood pressure as needed.  Update me as needed.  He agrees.  I thanked him for his effort.  Labs discussed.

## 2018-10-29 ENCOUNTER — Telehealth: Payer: Self-pay | Admitting: Family Medicine

## 2018-10-29 NOTE — Telephone Encounter (Signed)
Pt dropped off Employee physical verification to be filled out by Dr. Damita Dunnings. Placed form in Rx tower for Dr. Damita Dunnings.

## 2018-10-31 NOTE — Telephone Encounter (Signed)
Form done. Thanks. 

## 2018-11-12 NOTE — Telephone Encounter (Signed)
Best number [phone] Pt called checking on form.   Please call pt

## 2018-11-15 NOTE — Telephone Encounter (Signed)
Form was faxed as instructed on the form.  Patient advised and states that he will check to be sure they have it and will call back if it needs to be Re-faxed.

## 2019-08-29 ENCOUNTER — Other Ambulatory Visit: Payer: Self-pay | Admitting: Family Medicine

## 2019-08-29 ENCOUNTER — Other Ambulatory Visit: Payer: BLUE CROSS/BLUE SHIELD

## 2019-08-29 DIAGNOSIS — I10 Essential (primary) hypertension: Secondary | ICD-10-CM

## 2019-08-29 DIAGNOSIS — R739 Hyperglycemia, unspecified: Secondary | ICD-10-CM

## 2019-08-30 ENCOUNTER — Encounter: Payer: BLUE CROSS/BLUE SHIELD | Admitting: Family Medicine

## 2019-09-22 ENCOUNTER — Telehealth: Payer: Self-pay

## 2019-09-22 NOTE — Telephone Encounter (Signed)
LVM w COVID screen, front door and back lab info 1.7.2021 TLJ

## 2019-09-26 ENCOUNTER — Other Ambulatory Visit: Payer: Self-pay | Admitting: Family Medicine

## 2019-09-26 ENCOUNTER — Other Ambulatory Visit (INDEPENDENT_AMBULATORY_CARE_PROVIDER_SITE_OTHER): Payer: BC Managed Care – PPO

## 2019-09-26 ENCOUNTER — Other Ambulatory Visit: Payer: Self-pay

## 2019-09-26 DIAGNOSIS — I10 Essential (primary) hypertension: Secondary | ICD-10-CM | POA: Diagnosis not present

## 2019-09-26 DIAGNOSIS — R739 Hyperglycemia, unspecified: Secondary | ICD-10-CM | POA: Diagnosis not present

## 2019-09-26 LAB — LIPID PANEL
Cholesterol: 149 mg/dL (ref 0–200)
HDL: 44 mg/dL (ref >39.00)
LDL Cholesterol: 92 mg/dL (ref 0–99)
NonHDL: 105.11
Total CHOL/HDL Ratio: 3
Triglycerides: 65 mg/dL (ref 0.0–149.0)
VLDL: 13 mg/dL (ref 0.0–40.0)

## 2019-09-26 LAB — COMPREHENSIVE METABOLIC PANEL
ALT: 24 U/L (ref 0–53)
AST: 22 U/L (ref 0–37)
Albumin: 4.5 g/dL (ref 3.5–5.2)
Alkaline Phosphatase: 68 U/L (ref 39–117)
BUN: 20 mg/dL (ref 6–23)
CO2: 29 mEq/L (ref 19–32)
Calcium: 9.5 mg/dL (ref 8.4–10.5)
Chloride: 102 mEq/L (ref 96–112)
Creatinine, Ser: 1.21 mg/dL (ref 0.40–1.50)
GFR: 63.38 mL/min (ref >60.00)
Glucose, Bld: 106 mg/dL — ABNORMAL HIGH (ref 70–99)
Potassium: 3.7 mEq/L (ref 3.5–5.1)
Sodium: 139 mEq/L (ref 135–145)
Total Bilirubin: 0.7 mg/dL (ref 0.2–1.2)
Total Protein: 7.2 g/dL (ref 6.0–8.3)

## 2019-09-26 LAB — HEMOGLOBIN A1C: Hgb A1c MFr Bld: 5.3 % (ref 4.6–6.5)

## 2019-09-26 NOTE — Telephone Encounter (Signed)
Patient has an appointment tomorrow, will hold to make sure patient comes in and then refill. LOV 08/2018

## 2019-09-27 ENCOUNTER — Ambulatory Visit (INDEPENDENT_AMBULATORY_CARE_PROVIDER_SITE_OTHER): Payer: BC Managed Care – PPO | Admitting: Family Medicine

## 2019-09-27 ENCOUNTER — Encounter: Payer: Self-pay | Admitting: Family Medicine

## 2019-09-27 ENCOUNTER — Other Ambulatory Visit: Payer: Self-pay

## 2019-09-27 VITALS — BP 118/76 | HR 53 | Temp 97.4°F | Resp 16 | Ht 70.0 in | Wt 206.5 lb

## 2019-09-27 DIAGNOSIS — Z7189 Other specified counseling: Secondary | ICD-10-CM

## 2019-09-27 DIAGNOSIS — N529 Male erectile dysfunction, unspecified: Secondary | ICD-10-CM

## 2019-09-27 DIAGNOSIS — I10 Essential (primary) hypertension: Secondary | ICD-10-CM

## 2019-09-27 DIAGNOSIS — Z0000 Encounter for general adult medical examination without abnormal findings: Secondary | ICD-10-CM

## 2019-09-27 DIAGNOSIS — Z113 Encounter for screening for infections with a predominantly sexual mode of transmission: Secondary | ICD-10-CM

## 2019-09-27 MED ORDER — AMLODIPINE BESYLATE 10 MG PO TABS: 10.0000 mg | ORAL_TABLET | Freq: Every day | ORAL | 3 refills | Status: DC

## 2019-09-27 MED ORDER — SILDENAFIL CITRATE 20 MG PO TABS: 20.0000 mg | ORAL_TABLET | Freq: Three times a day (TID) | ORAL | 12 refills | Status: DC

## 2019-09-27 MED ORDER — HYDROCHLOROTHIAZIDE 12.5 MG PO CAPS: ORAL_CAPSULE | ORAL | 3 refills | Status: DC

## 2019-09-27 NOTE — Telephone Encounter (Signed)
rx already done at [facility].

## 2019-09-27 NOTE — Telephone Encounter (Signed)
Please remind me about this at the office visit.  Thanks.

## 2019-09-27 NOTE — Patient Instructions (Addendum)
Check to see if cologuard is covered and either way let me know.   Thanks for your effort.  Take care.  Glad to see you.  Update me as needed.   Go to the lab on the way out.  We'll contact you with your lab report.  Please fax me a copy of your paperwork at [phone].   See if sildenafil works.  Update me as needed.

## 2019-09-27 NOTE — Telephone Encounter (Signed)
Appointment today 09/27/2019 at 12:00 pm.  Refill after appointment if appropriate.

## 2019-09-27 NOTE — Progress Notes (Signed)
This visit occurred during the SARS-CoV-2 public health emergency.  Safety protocols were in place, including screening questions prior to the visit, additional usage of staff PPE, and extensive cleaning of exam room while observing appropriate contact time as indicated for disinfecting solutions.  CPE- See plan.  Routine anticipatory guidance given to patient.  See health maintenance.  The possibility exists that previously documented standard health maintenance information may have been brought forward from a previous encounter into this note.  If needed, that same information has been updated to reflect the current situation based on today's encounter.    Tetanus 2016  Flu encouraged for fall. PNA and shingles not due, d/w pt. D/w patient [id number] for colon cancer screening, including IFOB vs. colonoscopy.  Risks and benefits of both were discussed and patient voiced understanding.  Pt elects [id number]. Prostate cancer screening and PSA options (with potential risks and benefits of testing vs not testing) were discussed along with recent recs/guidelines.  He declined testing PSA at this point. Living will d/w pt. Mother designated if patient were incapacitated.  Diet and exercise d/w pt.   We talked about STD screening and sildenafil use since he is in a new relationship.  No STD sx. routine cautions given on sildenafil.  He will update me as needed.  Hypertension:    Using medication without problems or lightheadedness:  yes Chest pain with exertion:no Edema:no Short of breath:no  PMH and SH reviewed Meds, vitals, and allergies reviewed.   ROS: Per HPI.  Unless specifically indicated otherwise in HPI, the patient denies:  General: fever. Eyes: acute vision changes ENT: sore throat Cardiovascular: chest pain Respiratory: SOB GI: vomiting GU: dysuria Musculoskeletal: acute back pain Derm: acute rash Neuro: acute motor dysfunction Psych: worsening mood Endocrine:  polydipsia Heme: bleeding Allergy: hayfever  GEN: nad, alert and oriented HEENT: ncat NECK: supple w/o LA CV: rrr. PULM: ctab, no inc wob ABD: soft, +bs EXT: no edema SKIN: no acute rash

## 2019-09-28 LAB — C. TRACHOMATIS/N. GONORRHOEAE RNA
C. trachomatis RNA, TMA: NOT DETECTED
N. gonorrhoeae RNA, TMA: NOT DETECTED

## 2019-09-28 LAB — HIV ANTIBODY (ROUTINE TESTING W REFLEX): HIV 1&2 Ab, 4th Generation: NONREACTIVE

## 2019-09-28 LAB — RPR: RPR Ser Ql: NONREACTIVE

## 2019-09-28 NOTE — Assessment & Plan Note (Signed)
Living will d/w pt. Mother designated if patient were incapacitated.  

## 2019-09-28 NOTE — Assessment & Plan Note (Signed)
We talked about STD screening and sildenafil use since he is in a new relationship.  No STD sx. routine cautions given on sildenafil.  He will update me as needed.

## 2019-09-28 NOTE — Assessment & Plan Note (Signed)
Lipids are improved with intentional weight loss.  Labs discussed with patient.  Continue as is.  He agrees.  Continue work on diet and exercise.

## 2019-09-28 NOTE — Assessment & Plan Note (Signed)
Tetanus 2016  Flu encouraged for fall. PNA and shingles not due, d/w pt. D/w patient [id number] for colon cancer screening, including IFOB vs. colonoscopy.  Risks and benefits of both were discussed and patient voiced understanding.  Pt elects [id number]. Prostate cancer screening and PSA options (with potential risks and benefits of testing vs not testing) were discussed along with recent recs/guidelines.  He declined testing PSA at this point. Living will d/w pt. Mother designated if patient were incapacitated.   Diet and exercise d/w pt.

## 2019-09-29 MED ORDER — SILDENAFIL CITRATE 20 MG PO TABS: 20.0000 mg | ORAL_TABLET | Freq: Three times a day (TID) | ORAL | 12 refills | Status: DC

## 2019-10-04 ENCOUNTER — Telehealth: Payer: Self-pay | Admitting: Family Medicine

## 2019-10-04 DIAGNOSIS — Z1211 Encounter for screening for malignant neoplasm of colon: Secondary | ICD-10-CM

## 2019-10-04 NOTE — Telephone Encounter (Signed)
Please order Cologuard. Thanks. Received: 6 days ago Message Contents  Tonia Ghent, MD  Josetta Huddle, CMA   Lab was ordered. Awaiting Dr. Josefine Class signature and then I will fax

## 2019-10-05 NOTE — Telephone Encounter (Signed)
Form was signed yesterday by provider and this has been faxed to [employer]

## 2019-10-28 ENCOUNTER — Encounter: Payer: Self-pay | Admitting: Family Medicine

## 2019-10-28 NOTE — Addendum Note (Signed)
Addended by: Kris Mouton on: 10/28/2019 02:40 PM   Modules accepted: Orders

## 2019-11-17 DIAGNOSIS — Z1211 Encounter for screening for malignant neoplasm of colon: Secondary | ICD-10-CM | POA: Diagnosis not present

## 2019-11-17 LAB — COLOGUARD: Cologuard: NEGATIVE

## 2019-12-26 ENCOUNTER — Encounter: Payer: Self-pay | Admitting: Family Medicine

## 2019-12-26 NOTE — Progress Notes (Signed)
Cologuard/thx dmf 

## 2020-05-09 ENCOUNTER — Encounter: Payer: Self-pay | Admitting: Family Medicine

## 2020-09-25 ENCOUNTER — Telehealth: Payer: BC Managed Care – PPO | Admitting: Nurse Practitioner

## 2020-09-25 DIAGNOSIS — B9789 Other viral agents as the cause of diseases classified elsewhere: Secondary | ICD-10-CM | POA: Diagnosis not present

## 2020-09-25 DIAGNOSIS — J329 Chronic sinusitis, unspecified: Secondary | ICD-10-CM | POA: Diagnosis not present

## 2020-09-25 MED ORDER — FLUTICASONE PROPIONATE 50 MCG/ACT NA SUSP: 2.0000 | Freq: Every day | NASAL | 6 refills | Status: DC

## 2020-09-25 NOTE — Progress Notes (Signed)

## 2020-10-22 ENCOUNTER — Other Ambulatory Visit: Payer: Self-pay | Admitting: Family Medicine

## 2021-01-24 ENCOUNTER — Encounter: Payer: Self-pay | Admitting: Family Medicine

## 2021-01-25 ENCOUNTER — Other Ambulatory Visit: Payer: Self-pay | Admitting: Family Medicine

## 2021-01-25 MED ORDER — SCOPOLAMINE 1 MG/3DAYS TD PT72: 1.0000 | MEDICATED_PATCH | TRANSDERMAL | 1 refills | Status: DC

## 2021-02-17 ENCOUNTER — Other Ambulatory Visit: Payer: Self-pay | Admitting: Family Medicine

## 2021-02-17 DIAGNOSIS — I10 Essential (primary) hypertension: Secondary | ICD-10-CM

## 2021-02-17 DIAGNOSIS — R739 Hyperglycemia, unspecified: Secondary | ICD-10-CM

## 2021-02-19 ENCOUNTER — Other Ambulatory Visit: Payer: BC Managed Care – PPO

## 2021-02-26 ENCOUNTER — Encounter: Payer: Self-pay | Admitting: Family Medicine

## 2021-02-26 ENCOUNTER — Ambulatory Visit (INDEPENDENT_AMBULATORY_CARE_PROVIDER_SITE_OTHER): Payer: BC Managed Care – PPO | Admitting: Family Medicine

## 2021-02-26 ENCOUNTER — Other Ambulatory Visit: Payer: Self-pay

## 2021-02-26 VITALS — BP 122/82 | HR 70 | Temp 97.5°F | Ht 70.0 in | Wt 235.0 lb

## 2021-02-26 DIAGNOSIS — R739 Hyperglycemia, unspecified: Secondary | ICD-10-CM | POA: Diagnosis not present

## 2021-02-26 DIAGNOSIS — I10 Essential (primary) hypertension: Secondary | ICD-10-CM

## 2021-02-26 DIAGNOSIS — Z0000 Encounter for general adult medical examination without abnormal findings: Secondary | ICD-10-CM | POA: Diagnosis not present

## 2021-02-26 DIAGNOSIS — R0683 Snoring: Secondary | ICD-10-CM | POA: Diagnosis not present

## 2021-02-26 DIAGNOSIS — N529 Male erectile dysfunction, unspecified: Secondary | ICD-10-CM

## 2021-02-26 DIAGNOSIS — Z7189 Other specified counseling: Secondary | ICD-10-CM

## 2021-02-26 MED ORDER — SCOPOLAMINE 1 MG/3DAYS TD PT72: 1.0000 | MEDICATED_PATCH | TRANSDERMAL | 1 refills | Status: DC

## 2021-02-26 MED ORDER — HYDROCHLOROTHIAZIDE 12.5 MG PO CAPS: 12.5000 mg | ORAL_CAPSULE | Freq: Every day | ORAL | 3 refills | Status: DC

## 2021-02-26 MED ORDER — AMLODIPINE BESYLATE 10 MG PO TABS: 1.0000 | ORAL_TABLET | Freq: Every day | ORAL | 3 refills | Status: DC

## 2021-02-26 MED ORDER — SILDENAFIL CITRATE 20 MG PO TABS: 20.0000 mg | ORAL_TABLET | Freq: Three times a day (TID) | ORAL | 12 refills | Status: DC

## 2021-02-26 NOTE — Patient Instructions (Addendum)
We'll call about seeing pulmonary.   Keep work on your [employer].   Go to the lab on the way out.   If you have mychart we'll likely use that to update you.    Take care.  Glad to see you.  Shingrix/flu/covid.

## 2021-02-26 NOTE — Progress Notes (Signed)
This visit occurred during the SARS-CoV-2 public health emergency.  Safety protocols were in place, including screening questions prior to the visit, additional usage of staff PPE, and extensive cleaning of exam room while observing appropriate contact time as indicated for disinfecting solutions.  CPE- See plan.  Routine anticipatory guidance given to patient.  See health maintenance.  The possibility exists that previously documented standard health maintenance information may have been brought forward from a previous encounter into this note.  If needed, that same information has been updated to reflect the current situation based on today's encounter.    Tetanus 2016  Flu encouraged for fall. PNA not due shingles d/w pt.   Covid vaccine encouraged.  He had covid in 09/2020.   Cologuard done 2021 Prostate cancer screening and PSA options (with potential risks and benefits of testing vs not testing) were discussed along with recent recs/guidelines. He declined testing PSA at this point. Living will d/w pt. Mother designated if patient were incapacitated.  Diet and exercise d/w pt.  ED.  No NTG use.  It helped.  No ADE on med.    Girlfriend noted snoring at night. D/w pt about getting sleep study done.  Rationale discussed.  Pulmonary referral placed.  Scopolamine helped with motion sickness.  D/w pt. he can use when needed.  Hypertension:    Using medication without problems or lightheadedness: yes Chest pain with exertion: no Edema:no Short of breath:no  PMH and SH reviewed  Meds, vitals, and allergies reviewed.   ROS: Per HPI.  Unless specifically indicated otherwise in HPI, the patient denies:  General: fever. Eyes: acute vision changes ENT: sore throat Cardiovascular: chest pain Respiratory: SOB GI: vomiting GU: dysuria Musculoskeletal: acute back pain Derm: acute rash Neuro: acute motor dysfunction Psych: worsening mood Endocrine: polydipsia Heme: bleeding Allergy:  hayfever  GEN: nad, alert and oriented HEENT: ncat NECK: supple w/o LA, neck 16" circumference.   CV: rrr. PULM: ctab, no inc wob ABD: soft, +bs EXT: no edema SKIN: no acute rash

## 2021-02-27 LAB — COMPREHENSIVE METABOLIC PANEL
ALT: 58 U/L — ABNORMAL HIGH (ref 0–53)
AST: 34 U/L (ref 0–37)
Albumin: 4.7 g/dL (ref 3.5–5.2)
Alkaline Phosphatase: 69 U/L (ref 39–117)
BUN: 26 mg/dL — ABNORMAL HIGH (ref 6–23)
CO2: 28 mEq/L (ref 19–32)
Calcium: 9 mg/dL (ref 8.4–10.5)
Chloride: 101 mEq/L (ref 96–112)
Creatinine, Ser: 1.06 mg/dL (ref 0.40–1.50)
GFR: 81.09 mL/min (ref >60.00)
Glucose, Bld: 86 mg/dL (ref 70–99)
Potassium: 3.6 mEq/L (ref 3.5–5.1)
Sodium: 138 mEq/L (ref 135–145)
Total Bilirubin: 0.6 mg/dL (ref 0.2–1.2)
Total Protein: 7.5 g/dL (ref 6.0–8.3)

## 2021-02-27 LAB — LIPID PANEL
Cholesterol: 173 mg/dL (ref 0–200)
HDL: 41 mg/dL (ref >39.00)
LDL Cholesterol: 106 mg/dL — ABNORMAL HIGH (ref 0–99)
NonHDL: 132.05
Total CHOL/HDL Ratio: 4
Triglycerides: 131 mg/dL (ref 0.0–149.0)
VLDL: 26.2 mg/dL (ref 0.0–40.0)

## 2021-02-27 LAB — HEMOGLOBIN A1C: Hgb A1c MFr Bld: 5.5 % (ref 4.6–6.5)

## 2021-02-27 NOTE — Assessment & Plan Note (Signed)
  Tetanus 2016  Flu encouraged for fall. PNA not due shingles d/w pt.   Covid vaccine encouraged.  He had covid in 09/2020.   Cologuard done 2021 Prostate cancer screening and PSA options (with potential risks and benefits of testing vs not testing) were discussed along with recent recs/guidelines. He declined testing PSA at this point. Living will d/w pt. Mother designated if patient were incapacitated.  Diet and exercise d/w pt.

## 2021-02-27 NOTE — Assessment & Plan Note (Signed)
See notes on labs.  Continue work on diet and exercise.  Continue amlodipine and hydrochlorothiazide.

## 2021-02-27 NOTE — Assessment & Plan Note (Signed)
Refer to pulmonary

## 2021-02-27 NOTE — Assessment & Plan Note (Signed)
Living will d/w pt. Mother designated if patient were incapacitated.  

## 2021-02-27 NOTE — Assessment & Plan Note (Signed)
Continue as needed sildenafil. 

## 2021-03-03 ENCOUNTER — Other Ambulatory Visit: Payer: Self-pay | Admitting: Family Medicine

## 2021-03-03 DIAGNOSIS — R7989 Other specified abnormal findings of blood chemistry: Secondary | ICD-10-CM

## 2021-03-19 ENCOUNTER — Ambulatory Visit
Admission: RE | Admit: 2021-03-19 | Discharge: 2021-03-19 | Disposition: A | Discharge status: Home / Self Care | Payer: BC Managed Care – PPO | Source: Ambulatory Visit | Attending: Family Medicine | Admitting: Family Medicine

## 2021-03-19 ENCOUNTER — Encounter: Payer: Self-pay | Admitting: Family Medicine

## 2021-03-19 ENCOUNTER — Other Ambulatory Visit: Payer: Self-pay | Admitting: Family Medicine

## 2021-03-19 ENCOUNTER — Other Ambulatory Visit: Payer: Self-pay

## 2021-03-19 DIAGNOSIS — R7989 Other specified abnormal findings of blood chemistry: Secondary | ICD-10-CM

## 2021-03-19 DIAGNOSIS — K760 Fatty (change of) liver, not elsewhere classified: Secondary | ICD-10-CM | POA: Diagnosis not present

## 2021-04-15 ENCOUNTER — Other Ambulatory Visit: Payer: Self-pay

## 2021-04-15 ENCOUNTER — Ambulatory Visit (INDEPENDENT_AMBULATORY_CARE_PROVIDER_SITE_OTHER): Payer: BC Managed Care – PPO | Admitting: Pulmonary Disease

## 2021-04-15 ENCOUNTER — Encounter: Payer: Self-pay | Admitting: Pulmonary Disease

## 2021-04-15 VITALS — BP 130/84 | HR 71 | Temp 98.1°F | Ht 70.0 in | Wt 231.6 lb

## 2021-04-15 DIAGNOSIS — R0683 Snoring: Secondary | ICD-10-CM | POA: Diagnosis not present

## 2021-04-15 NOTE — Patient Instructions (Signed)
Moderate probability of significant obstructive sleep apnea  We will schedule you for home sleep study Update you with results as soon as reviewed  Treatment options as we discussed  I will see you in 3 to 4 months  Sleep Apnea Sleep apnea affects breathing during sleep. It causes breathing to stop for 10 seconds or more, or to become shallow. People with sleep apnea usually snoreloudly. It can also increase the risk of: Heart attack. Stroke. Being very overweight (obese). Diabetes. Heart failure. Irregular heartbeat. High blood pressure. The goal of treatment is to help you breathe normally again. What are the causes?  The most common cause of this condition is a collapsed or blocked airway. There are three kinds of sleep apnea: Obstructive sleep apnea. This is caused by a blocked or collapsed airway. Central sleep apnea. This happens when the brain does not send the right signals to the muscles that control breathing. Mixed sleep apnea. This is a combination of obstructive and central sleep apnea. What increases the risk? Being overweight. Smoking. Having a small airway. Being older. Being male. Drinking alcohol. Taking medicines to calm yourself (sedatives or tranquilizers). Having family members with the condition. Having a tongue or tonsils that are larger than normal. What are the signs or symptoms? Trouble staying asleep. Loud snoring. Headaches in the morning. Waking up gasping. Dry mouth or sore throat in the morning. Being sleepy or tired during the day. If you are sleepy or tired during the day, you may also: Not be able to focus your mind (concentrate). Forget things. Get angry a lot and have mood swings. Feel sad (depressed). Have changes in your personality. Have less interest in sex, if you are male. Be unable to have an erection, if you are male. How is this treated?  Sleeping on your side. Using a medicine to get rid of mucus in your nose  (decongestant). Avoiding the use of alcohol, medicines to help you relax, or certain pain medicines (narcotics). Losing weight, if needed. Changing your diet. Quitting smoking. Using a machine to open your airway while you sleep, such as: An oral appliance. This is a mouthpiece that shifts your lower jaw forward. A CPAP device. This device blows air through a mask when you breathe out (exhale). An EPAP device. This has valves that you put in each nostril. A BPAP device. This device blows air through a mask when you breathe in (inhale) and breathe out. Having surgery if other treatments do not work. Follow these instructions at home: Lifestyle Make changes that your doctor recommends. Eat a healthy diet. Lose weight if needed. Avoid alcohol, medicines to help you relax, and some pain medicines. Do not smoke or use any products that contain nicotine or tobacco. If you need help quitting, ask your doctor. General instructions Take over-the-counter and prescription medicines only as told by your doctor. If you were given a machine to use while you sleep, use it only as told by your doctor. If you are having surgery, make sure to tell your doctor you have sleep apnea. You may need to bring your device with you. Keep all follow-up visits. Contact a doctor if: The machine that you were given to use during sleep bothers you or does not seem to be working. You do not get better. You get worse. Get help right away if: Your chest hurts. You have trouble breathing in enough air. You have an uncomfortable feeling in your back, arms, or stomach. You have trouble talking. One  side of your body feels weak. A part of your face is hanging down. These symptoms may be an emergency. Get help right away. Call your local emergency services (911 in the [place] Do not wait to see if the symptoms will go away. Do not drive yourself to the hospital. Summary This condition affects breathing during  sleep. The most common cause is a collapsed or blocked airway. The goal of treatment is to help you breathe normally while you sleep. This information is not intended to replace advice given to you by your health care provider. Make sure you discuss any questions you have with your healthcare provider. Document Revised: 08/10/2020 Document Reviewed: 08/10/2020 Elsevier Patient Education  2022 [employer].

## 2021-04-15 NOTE — Progress Notes (Signed)
Scott Franco    [id number]    [birth year]  Primary Care Physician:Duncan, Elveria Rising, MD  Referring Physician: Tonia Ghent, MD [id number] [place],  [place] [address]  Chief complaint:   Patient with a history of snoring, witnessed apnea  HPI:  Increasing snoring, significant other notices apneas Was evaluated in 2017, was not able to complete a sleep study back then Denies significant daytime sleepiness Occasional dryness of his mouth in the mornings No night sweats No morning headaches No family history of sleep apnea  Usually goes to bed about 9:51 PM, falls asleep in 5 to 10 minutes About 2 awakenings Final wake up time between 5 and 5:30 AM  Weight has fluctuated over the last few years  Reformed smoker, quit in 1999, 15-pack-year smoking history   Outpatient Encounter Medications as of 04/15/2021  Medication Sig   amLODipine (NORVASC) 10 MG tablet Take 1 tablet (10 mg total) by mouth daily.   fluticasone (FLONASE) 50 MCG/ACT nasal spray Place 2 sprays into both nostrils daily.   hydrochlorothiazide (MICROZIDE) 12.5 MG capsule Take 1 capsule (12.5 mg total) by mouth daily.   sildenafil (REVATIO) 20 MG tablet Take 1-5 tablets (20-100 mg total) by mouth 3 (three) times daily.   scopolamine (TRANSDERM-SCOP, 1.5 MG,) 1 MG/3DAYS Place 1 patch (1.5 mg total) onto the skin every 3 (three) days. (Patient not taking: Reported on 04/15/2021)   No facility-administered encounter medications on file as of 04/15/2021.    Allergies as of 04/15/2021 - Review Complete 04/15/2021  Allergen Reaction Noted   Ace inhibitors  03/22/2013   Angiotensin receptor blockers  03/22/2013    Past Medical History:  Diagnosis Date   Depression    Fatty liver    GERD (gastroesophageal reflux disease)    Hyperlipidemia    Hypertension     Past Surgical History:  Procedure Laterality Date   ADENOIDECTOMY     VASECTOMY      Family History  Problem Relation Age  of Onset   Hypertension Mother    Cancer Father        Lung CA, smoker   Hypertension Father    Prostate cancer Neg Hx    Colon cancer Neg Hx     Social History   Socioeconomic History   Marital status: Married    Spouse name: Not on file   Number of children: 2   Years of education: Not on file   Highest education level: Not on file  Occupational History   Occupation: Copy: [employer]  Tobacco Use   Smoking status: Former    Packs/day: 1.00    Years: 14.00    Pack years: 14.00    Types: Cigarettes    Start date: 09/15/1984    Quit date: 09/15/1997    Years since quitting: 23.5   Smokeless tobacco: Never  Substance and Sexual Activity   Alcohol use: Not Currently    Comment: none as of 2018   Drug use: No   Sexual activity: Not on file  Other Topics Concern   Not on file  Social History Narrative   From [place].     Married 1998, Divorced ~2011   2 sons.  1 adult.  Has joint custody of his younger son who lives with him 100% of the time.   Social Determinants of Health   Financial Resource Strain: Not on  file  Food Insecurity: Not on file  Transportation Needs: Not on file  Physical Activity: Not on file  Stress: Not on file  Social Connections: Not on file  Intimate Partner Violence: Not on file    Review of Systems  HENT:  Negative for congestion.   Respiratory:  Negative for shortness of breath.   Psychiatric/Behavioral:  Positive for sleep disturbance.    Vitals:   04/15/21 1500  BP: 130/84  Pulse: 71  Temp: 98.1 F (36.7 C)  SpO2: 96%     Physical Exam Constitutional:      Appearance: He is obese.  HENT:     Head: Normocephalic.     Nose: No congestion.     Mouth/Throat:     Mouth: Mucous membranes are moist.     Comments: Mallampati 3, crowded oropharynx Cardiovascular:     Rate and Rhythm: Normal rate and regular rhythm.     Heart sounds: No murmur heard.   No friction rub.   Pulmonary:     Effort: No respiratory distress.     Breath sounds: No stridor. No wheezing or rhonchi.  Musculoskeletal:     Cervical back: No rigidity or tenderness.  Neurological:     General: No focal deficit present.     Mental Status: He is alert.  Psychiatric:        Mood and Affect: Mood normal.   Results of the Epworth flowsheet 04/15/2021  Sitting and reading 0  Watching TV 2  Sitting, inactive in a public place (e.g. a theatre or a meeting) 0  As a passenger in a car for an hour without a break 0  Lying down to rest in the afternoon when circumstances permit 1  Sitting and talking to someone 0  Sitting quietly after a lunch without alcohol 1  In a car, while stopped for a few minutes in traffic 0  Total score 4    Data Reviewed: No previous studies  Assessment:  Moderate probability of significant obstructive sleep apnea  Witnessed apneas, snoring, multiple awakenings  Pathophysiology of sleep disordered breathing discussed with the patient Treatment options discussed with the patient   Plan/Recommendations: We will schedule patient for a home sleep study  Treatment options already reviewed with the patient  Graded exercises, weight loss efforts encouraged   Sherrilyn Rist MD [facility] 04/15/2021, 3:04 PM  CC: Tonia Ghent, MD

## 2021-05-28 ENCOUNTER — Ambulatory Visit: Payer: BC Managed Care – PPO

## 2021-05-28 ENCOUNTER — Other Ambulatory Visit: Payer: Self-pay

## 2021-05-28 DIAGNOSIS — R0683 Snoring: Secondary | ICD-10-CM

## 2021-05-28 DIAGNOSIS — G4733 Obstructive sleep apnea (adult) (pediatric): Secondary | ICD-10-CM | POA: Diagnosis not present

## 2021-06-06 ENCOUNTER — Telehealth: Payer: Self-pay | Admitting: Pulmonary Disease

## 2021-06-06 DIAGNOSIS — G4733 Obstructive sleep apnea (adult) (pediatric): Secondary | ICD-10-CM | POA: Diagnosis not present

## 2021-06-06 NOTE — Telephone Encounter (Signed)
ATC, left VM. 

## 2021-06-06 NOTE — Telephone Encounter (Signed)
Call patient  Sleep study result  Date of study: 05/28/2021  Impression: Mild obstructive sleep apnea Mild oxygen desaturations  Recommendation:  Options of treatment for mild obstructive sleep apnea  CPAP therapy may be considered if patient has significant daytime symptoms that can be ascribed to be an effect of sleep disordered breathing. If CPAP therapy is chosen as option of treatment, auto titrating CPAP with pressure settings of 5-15 will be appropriate  An oral device may also be considered as an option of treatment for mild sleep disordered breathing  Watchful waiting with focus on weight loss and sleep position modification may also be appropriate if not symptomatic. Sleep position optimization by encouraging sleep in a lateral position, elevating the head of the bed by about 30 degrees may help  Weight loss measures encouraged  He should be cautioned against driving when sleepy & against medications with sedative side effects  Follow-up as previously scheduled

## 2021-06-06 NOTE — Telephone Encounter (Signed)
Called and spoke with pt and he stated that he is going to think about his options and he will call back next week to let us know what he decides.

## 2021-06-06 NOTE — Telephone Encounter (Signed)
Pt returning missed call [phone]

## 2021-06-17 ENCOUNTER — Telehealth: Payer: Self-pay | Admitting: Family Medicine

## 2021-06-17 MED ORDER — SCOPOLAMINE 1 MG/3DAYS TD PT72: 1.0000 | MEDICATED_PATCH | TRANSDERMAL | 1 refills | Status: DC

## 2021-06-17 NOTE — Telephone Encounter (Signed)
Refill request for Scopolamine 1 mg   LOV - 02/26/21 Next OV - not scheduled Last refill - 02/26/21 #4/1

## 2021-06-17 NOTE — Addendum Note (Signed)
Addended by: Tonia Ghent on: 06/17/2021 01:20 PM   Modules accepted: Orders

## 2021-06-17 NOTE — Telephone Encounter (Signed)
  Encourage patient to contact the pharmacy for refills or they can request refills through [place]:  Please schedule appointment if longer than 1 year  NEXT APPOINTMENT DATE:06/19/21  MEDICATION:scopolamine (TRANSDERM-SCOP, 1.5 MG,) 1 MG/3DAYS  Is the patient out of medication? YES  PHARMACY:[id number] - [place], [place] - [address]  Let patient know to contact pharmacy at the end of the day to make sure medication is ready.  Please notify patient to allow 48-72 hours to process  CLINICAL FILLS OUT ALL BELOW:   LAST REFILL:  QTY:  REFILL DATE:    OTHER COMMENTS:    Okay for refill?  Please advise

## 2021-06-17 NOTE — Telephone Encounter (Signed)
Sent. Thanks.   

## 2021-06-19 ENCOUNTER — Other Ambulatory Visit (INDEPENDENT_AMBULATORY_CARE_PROVIDER_SITE_OTHER): Payer: BC Managed Care – PPO

## 2021-06-19 ENCOUNTER — Other Ambulatory Visit: Payer: Self-pay

## 2021-06-19 DIAGNOSIS — R7989 Other specified abnormal findings of blood chemistry: Secondary | ICD-10-CM

## 2021-06-19 LAB — HEPATIC FUNCTION PANEL
ALT: 27 U/L (ref 0–53)
AST: 24 U/L (ref 0–37)
Albumin: 4.6 g/dL (ref 3.5–5.2)
Alkaline Phosphatase: 63 U/L (ref 39–117)
Bilirubin, Direct: 0.1 mg/dL (ref 0.0–0.3)
Total Bilirubin: 0.7 mg/dL (ref 0.2–1.2)
Total Protein: 7.2 g/dL (ref 6.0–8.3)

## 2021-06-20 ENCOUNTER — Encounter: Payer: Self-pay | Admitting: Family Medicine

## 2021-12-17 ENCOUNTER — Telehealth: Payer: Self-pay | Admitting: Pulmonary Disease

## 2021-12-17 NOTE — Telephone Encounter (Signed)
Home sleep study and LOV faxed. Nothing further needed.  ?

## 2022-01-19 ENCOUNTER — Encounter: Payer: Self-pay | Admitting: Family Medicine

## 2022-01-22 ENCOUNTER — Other Ambulatory Visit: Payer: Self-pay | Admitting: Family Medicine

## 2022-01-22 MED ORDER — SCOPOLAMINE 1 MG/3DAYS TD PT72: 1.0000 | MEDICATED_PATCH | TRANSDERMAL | 1 refills | Status: DC

## 2022-02-21 ENCOUNTER — Other Ambulatory Visit: Payer: BC Managed Care – PPO

## 2022-02-23 ENCOUNTER — Other Ambulatory Visit: Payer: Self-pay | Admitting: Family Medicine

## 2022-02-25 ENCOUNTER — Other Ambulatory Visit: Payer: Self-pay | Admitting: Family Medicine

## 2022-03-03 ENCOUNTER — Encounter: Payer: BC Managed Care – PPO | Admitting: Family Medicine

## 2022-03-16 ENCOUNTER — Other Ambulatory Visit: Payer: Self-pay | Admitting: Family Medicine

## 2022-03-16 DIAGNOSIS — I10 Essential (primary) hypertension: Secondary | ICD-10-CM

## 2022-03-17 ENCOUNTER — Telehealth: Payer: Self-pay | Admitting: Pulmonary Disease

## 2022-03-17 ENCOUNTER — Other Ambulatory Visit (INDEPENDENT_AMBULATORY_CARE_PROVIDER_SITE_OTHER): Payer: BC Managed Care – PPO

## 2022-03-17 DIAGNOSIS — I10 Essential (primary) hypertension: Secondary | ICD-10-CM

## 2022-03-17 LAB — COMPREHENSIVE METABOLIC PANEL
ALT: 17 U/L (ref 0–53)
AST: 18 U/L (ref 0–37)
Albumin: 4.3 g/dL (ref 3.5–5.2)
Alkaline Phosphatase: 66 U/L (ref 39–117)
BUN: 22 mg/dL (ref 6–23)
CO2: 26 mEq/L (ref 19–32)
Calcium: 9.3 mg/dL (ref 8.4–10.5)
Chloride: 104 mEq/L (ref 96–112)
Creatinine, Ser: 1.11 mg/dL (ref 0.40–1.50)
GFR: 76.16 mL/min (ref >60.00)
Glucose, Bld: 100 mg/dL — ABNORMAL HIGH (ref 70–99)
Potassium: 3.7 mEq/L (ref 3.5–5.1)
Sodium: 140 mEq/L (ref 135–145)
Total Bilirubin: 0.8 mg/dL (ref 0.2–1.2)
Total Protein: 6.8 g/dL (ref 6.0–8.3)

## 2022-03-17 LAB — LIPID PANEL
Cholesterol: 138 mg/dL (ref 0–200)
HDL: 46.5 mg/dL (ref >39.00)
LDL Cholesterol: 79 mg/dL (ref 0–99)
NonHDL: 91.02
Total CHOL/HDL Ratio: 3
Triglycerides: 59 mg/dL (ref 0.0–149.0)
VLDL: 11.8 mg/dL (ref 0.0–40.0)

## 2022-03-17 NOTE — Telephone Encounter (Signed)
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 Advised him we have not received it as of yet. Gave him 2 different fax numbers to send it to. He expressed understanding and said if it does not work he will bring it by the office on Wednesday. Will route to Dr. Jenetta Downer and Barnet Pall to be on the look out for form

## 2022-03-19 NOTE — Telephone Encounter (Signed)
I have left a message that the patient would need an OV per DPR to make an OV to get his form filled out. Waiting on a call back.

## 2022-03-24 ENCOUNTER — Ambulatory Visit (INDEPENDENT_AMBULATORY_CARE_PROVIDER_SITE_OTHER): Payer: BC Managed Care – PPO | Admitting: Family Medicine

## 2022-03-24 ENCOUNTER — Encounter: Payer: Self-pay | Admitting: Family Medicine

## 2022-03-24 VITALS — BP 112/70 | HR 60 | Temp 97.1°F | Ht 70.0 in | Wt 202.0 lb

## 2022-03-24 DIAGNOSIS — Z0000 Encounter for general adult medical examination without abnormal findings: Secondary | ICD-10-CM

## 2022-03-24 DIAGNOSIS — Z7189 Other specified counseling: Secondary | ICD-10-CM

## 2022-03-24 DIAGNOSIS — I10 Essential (primary) hypertension: Secondary | ICD-10-CM

## 2022-03-24 MED ORDER — HYDROCHLOROTHIAZIDE 12.5 MG PO CAPS: ORAL_CAPSULE | ORAL | 3 refills | Status: DC

## 2022-03-24 NOTE — Assessment & Plan Note (Signed)
Living will d/w pt. Mother designated if patient were incapacitated.  Then his son Windle Guard designated if needed.

## 2022-03-24 NOTE — Progress Notes (Unsigned)
CPE- See plan.  Routine anticipatory guidance given to patient.  See health maintenance.  The possibility exists that previously documented standard health maintenance information may have been brought forward from a previous encounter into this note.  If needed, that same information has been updated to reflect the current situation based on today's encounter.    Tetanus 2016  Flu encouraged for fall. PNA not due shingles d/w pt.   Covid vaccine encouraged.  He had covid in 09/2020.   Cologuard done 2021 Prostate cancer screening and PSA options (with potential risks and benefits of testing vs not testing) were discussed along with recent recs/guidelines. He declined testing PSA at this point. Living will d/w pt. Mother designated if patient were incapacitated.  Then his son Windle Guard designated if needed.   Diet and exercise d/w pt.  30 lbs weight loss with diet and exercise as of 2023.   He had occ lapses of memory but no red flag events.  D/w pt about monitoring.     Hypertension:    Using medication without problems or lightheadedness: yes Chest pain with exertion:no Edema:no Short of breath:no Labs d/w pt.    Scopolamine helped with motion sickness.  D/w pt. he can use when needed.  PMH and SH reviewed  Meds, vitals, and allergies reviewed.   ROS: Per HPI.  Unless specifically indicated otherwise in HPI, the patient denies:  General: fever. Eyes: acute vision changes ENT: sore throat Cardiovascular: chest pain Respiratory: SOB GI: vomiting GU: dysuria Musculoskeletal: acute back pain Derm: acute rash Neuro: acute motor dysfunction Psych: worsening mood Endocrine: polydipsia Heme: bleeding Allergy: hayfever  GEN: nad, alert and oriented HEENT: ncat NECK: supple w/o LA CV: rrr. PULM: ctab, no inc wob ABD: soft, +bs EXT: no edema SKIN: no acute rash

## 2022-03-24 NOTE — Patient Instructions (Addendum)
If you get lightheaded, then stop the HCTZ and let me know.  Take care.  Glad to see you. Thanks for your effort.

## 2022-03-26 NOTE — Assessment & Plan Note (Signed)
Tetanus 2016  Flu encouraged for fall. PNA not due shingles d/w pt.   Covid vaccine encouraged.  He had covid in 09/2020.   Cologuard done 2021 Prostate cancer screening and PSA options (with potential risks and benefits of testing vs not testing) were discussed along with recent recs/guidelines. He declined testing PSA at this point. Living will d/w pt. Mother designated if patient were incapacitated.  Then his son Windle Guard designated if needed.   Diet and exercise d/w pt.  30 lbs weight loss with diet and exercise as of 2023.   He had occ lapses of memory but no red flag events.  D/w pt about monitoring.

## 2022-03-26 NOTE — Assessment & Plan Note (Signed)
Continue work on diet and exercise.  Continue amlodipine and hydrochlorothiazide.  Labs discussed with patient.  If lightheaded then stop hydrochlorothiazide let me know.  He agrees with plan.

## 2022-03-27 ENCOUNTER — Ambulatory Visit: Payer: BC Managed Care – PPO | Admitting: Nurse Practitioner

## 2022-03-27 ENCOUNTER — Encounter: Payer: Self-pay | Admitting: Nurse Practitioner

## 2022-03-27 DIAGNOSIS — G4733 Obstructive sleep apnea (adult) (pediatric): Secondary | ICD-10-CM | POA: Diagnosis not present

## 2022-03-27 NOTE — Assessment & Plan Note (Signed)
Mild OSA on previous HST; never start on CPAP as he has no significant daytime symptoms. He was advised on weight loss measures and side lying sleeping position, which he has implemented. He has lost 30 pounds as well. 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 Advised to notify if he were to develop any daytime symptoms, increased snoring or apneic events at night.  Patient Instructions  Doristine Devoid job on your weight loss! Keep up the good work. Continue side lying sleeping position Cautioned to be aware of drowsy driving  Letter provided today stating you do not need CPAP therapy.  Follow up in one year with Dr. Ander Slade. If symptoms do not improve or worsen, please contact office for sooner follow up or seek emergency care.

## 2022-03-27 NOTE — Patient Instructions (Addendum)
Great job on your [employer] loss! Keep up the good work. Continue side lying sleeping position Cautioned to be aware of drowsy driving  Letter provided today stating you do not need CPAP therapy.  Follow up in one year with Dr. Ander Slade. If symptoms do not improve or worsen, please contact office for sooner follow up or seek emergency care.

## 2022-03-27 NOTE — Progress Notes (Signed)
$'@Patient'b$  ID: Scott Franco, male    DOB: Jan 18, 1971, 51 y.o.   MRN: [id number]  Chief Complaint  Patient presents with   Follow-up    Pt states he has loss some weight and he feels much better.     Referring provider: Tonia Ghent, MD  HPI: 51 year old male, former smoker followed for mild OSA not on CPAP.  He is a patient of Dr. Judson Roch and last seen in office 04/15/2021.  Past medical history significant for hypertension, GERD, HLD.  TEST/EVENTS:  05/28/2021 HST: AHI 8.8/h, SPO2 low 82% with average 93%  04/15/2021: OV with Dr. Ander Slade for sleep consult. He has a history of snoring and witness apnea. Previously evaluated in 2017 but was unable to complete sleep study. Falls asleep easily with around 2 awakenings and gets around 7 hours of sleep a night. HST ordered for further evaluation. Recommended on graded exercises and weight loss efforts.   03/27/2022:Today - follow up Patient presents today for follow up. He recently had a DOT physical and needs a letter stating he does not need to be on CPAP therapy. Since we saw him last, he reports he's been doing well. He sleeps well at night and wakes feeling rested. He also has not been snoring as much after his recent weight loss. He has been actively trying to get down to a healthier weight and has lost a total of 30 pounds. He denies any morning headaches, drowsy driving, narcolepsy or cataplexy. He has no concerns or complaints today.   Allergies  Allergen Reactions   Ace Inhibitors     angioedema   Angiotensin Receptor Blockers     Angioedema with ACE    Immunization History  Administered Date(s) Administered   Td 06/19/2005   Tdap 11/14/2014    Past Medical History:  Diagnosis Date   Depression    Fatty liver    GERD (gastroesophageal reflux disease)    Hyperlipidemia    Hypertension     Tobacco History: Social History   Tobacco Use  Smoking Status Former   Packs/day: 1.00   Years: 14.00   Total pack  years: 14.00   Types: Cigarettes   Start date: 09/15/1984   Quit date: 09/15/1997   Years since quitting: 24.5   Passive exposure: Past  Smokeless Tobacco Never   Counseling given: Not Answered   Outpatient Medications Prior to Visit  Medication Sig Dispense Refill   amLODipine (NORVASC) 10 MG tablet TAKE 1 TABLET BY MOUTH EVERY DAY 90 tablet 3   hydrochlorothiazide (MICROZIDE) 12.5 MG capsule TAKE 1 CAPSULE BY MOUTH EVERY DAY 90 capsule 3   scopolamine (TRANSDERM-SCOP, 1.5 MG,) 1 MG/3DAYS Place 1 patch (1.5 mg total) onto the skin every 3 (three) days. 4 patch 1   sildenafil (REVATIO) 20 MG tablet Take 1-5 tablets (20-100 mg total) by mouth 3 (three) times daily. 50 tablet 12   No facility-administered medications prior to visit.     Review of Systems:   Constitutional: No weight loss or gain, night sweats, fevers, chills, fatigue, or lassitude. HEENT: No headaches, difficulty swallowing, tooth/dental problems, or sore throat. No sneezing, itching, ear ache, nasal congestion, or post nasal drip CV:  No chest pain, orthopnea, PND, swelling in lower extremities, anasarca, dizziness, palpitations, syncope Resp: No shortness of breath with exertion or at rest. No excess mucus or change in color of mucus. No productive or non-productive. No hemoptysis. No wheezing.  No chest wall deformity Skin: No rash, lesions, ulcerations MSK:  No joint pain or swelling.  No decreased range of motion.  No back pain. Neuro: No dizziness or lightheadedness.  Psych: No depression or anxiety. Mood stable.     Physical Exam:  BP 124/82 (BP Location: Right Arm, Patient Position: Sitting, Cuff Size: Normal)   Pulse (!) 53   Temp 98.5 F (36.9 C) (Oral)   Ht '5\' 10"'$  (1.778 m)   Wt 201 lb 3.2 oz (91.3 kg)   SpO2 99%   BMI 28.87 kg/m   GEN: Pleasant, interactive, well-appearing; in no acute distress. HEENT:  Normocephalic and atraumatic. PERRLA. Sclera white. Nasal turbinates pink, moist and patent  bilaterally. No rhinorrhea present. Oropharynx pink and moist, without exudate or edema. No lesions, ulcerations, or postnasal drip.  NECK:  Supple w/ fair ROM. No JVD present.  CV: RRR, no m/r/g, no peripheral edema. Pulses intact, +2 bilaterally. No cyanosis, pallor or clubbing. PULMONARY:  Unlabored, regular breathing. Clear bilaterally A&P w/o wheezes/rales/rhonchi. No accessory muscle use. No dullness to percussion. GI: BS present and normoactive. Soft, non-tender to palpation. No organomegaly or masses detected. No CVA tenderness. MSK: No erythema, warmth or tenderness. Cap refil <2 sec all extrem. No deformities or joint swelling noted.  Neuro: A/Ox3. No focal deficits noted.   Skin: Warm, no lesions or rashe Psych: Normal affect and behavior. Judgement and thought content appropriate.     Lab Results:  CBC    Component Value Date/Time   WBC 8.2 05/30/2015 1712   RBC 4.93 05/30/2015 1712   HGB 15.9 05/30/2015 1712   HGB 16.0 05/17/2013 0919   HCT 45.5 05/30/2015 1712   HCT 46.0 05/17/2013 0919   PLT 217.0 05/30/2015 1712   PLT 212 05/17/2013 0919   MCV 92.3 05/30/2015 1712   MCV 89 05/17/2013 0919   MCH 31.2 05/17/2013 0919   MCHC 35.0 05/30/2015 1712   RDW 12.3 05/30/2015 1712   RDW 12.5 05/17/2013 0919   LYMPHSABS 2.8 05/30/2015 1712   MONOABS 0.6 05/30/2015 1712   EOSABS 0.2 05/30/2015 1712   BASOSABS 0.1 05/30/2015 1712    BMET    Component Value Date/Time   NA 140 03/17/2022 0754   NA 138 05/17/2013 0919   K 3.7 03/17/2022 0754   K 3.7 05/17/2013 0919   CL 104 03/17/2022 0754   CL 104 05/17/2013 0919   CO2 26 03/17/2022 0754   CO2 27 05/17/2013 0919   GLUCOSE 100 (H) 03/17/2022 0754   GLUCOSE 107 (H) 05/17/2013 0919   BUN 22 03/17/2022 0754   BUN 18 05/17/2013 0919   CREATININE 1.11 03/17/2022 0754   CREATININE 1.22 05/17/2013 0919   CALCIUM 9.3 03/17/2022 0754   CALCIUM 8.8 05/17/2013 0919   GFRNONAA >60 05/17/2013 0919   GFRAA >60 05/17/2013 0919     BNP No results found for: "BNP"   Imaging:  No results found.        No data to display          No results found for: "NITRICOXIDE"      Assessment & Plan:   Mild obstructive sleep apnea Mild OSA on previous HST; never start on CPAP as he has no significant daytime symptoms. He was advised on weight loss measures and side lying sleeping position, which he has implemented. He has lost 30 pounds as well. 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 Advised to notify if he were to develop any daytime symptoms, increased  snoring or apneic events at night.  Patient Instructions  Doristine Devoid job on your weight loss! Keep up the good work. Continue side lying sleeping position Cautioned to be aware of drowsy driving  Letter provided today stating you do not need CPAP therapy.  Follow up in one year with Dr. Ander Slade. If symptoms do not improve or worsen, please contact office for sooner follow up or seek emergency care.     I spent 25 minutes of dedicated to the care of this patient on the date of this encounter to include pre-visit review of records, face-to-face time with the patient discussing conditions above, post visit ordering of testing, clinical documentation with the electronic health record, making appropriate referrals as documented, and communicating necessary findings to members of the patients care team.  Clayton Bibles, NP 03/27/2022  Pt aware and understands NP's role.

## 2022-03-31 ENCOUNTER — Encounter: Payer: Self-pay | Admitting: Family

## 2022-03-31 ENCOUNTER — Ambulatory Visit (INDEPENDENT_AMBULATORY_CARE_PROVIDER_SITE_OTHER): Payer: BC Managed Care – PPO | Admitting: Family

## 2022-03-31 VITALS — BP 122/64 | HR 60 | Temp 98.6°F | Resp 16 | Ht 70.0 in | Wt 203.4 lb

## 2022-03-31 DIAGNOSIS — H00011 Hordeolum externum right upper eyelid: Secondary | ICD-10-CM | POA: Diagnosis not present

## 2022-03-31 MED ORDER — NEOMYCIN-POLYMYXIN-DEXAMETH 3.5-10000-0.1 OP SUSP: 2.0000 [drp] | OPHTHALMIC | 0 refills | Status: AC

## 2022-03-31 NOTE — Patient Instructions (Signed)
Due to recent changes in healthcare laws, you may see results of your imaging and/or laboratory studies on MyChart before I have had a chance to review them.  I understand that in some cases there may be results that are confusing or concerning to you. Please understand that not all results are received at the same time and often I may need to interpret multiple results in order to provide you with the best plan of care or course of treatment. Therefore, I ask that you please give me 2 business days to thoroughly review all your results before contacting my office for clarification. Should we see a critical lab result, you will be contacted sooner.   It was a pleasure seeing you today! Please do not hesitate to reach out with any questions and or concerns.  Regards,   Taryn Nave FNP-C  

## 2022-03-31 NOTE — Progress Notes (Signed)
Established Patient Office Visit  Subjective:  Patient ID: Scott Franco, male    DOB: 1970-09-27  Age: 51 y.o. MRN: [id number]  CC:  Chief Complaint  Patient presents with   Stye    X 10 days right eye. Done warm compresses and eyes drop for stye.    HPI Scott Franco is here today with concerns.   For the last ten days with right upper stye.  Can not seem to get rid of at this point Took some otc stye medication with warm compresses, and no relief.  Not overly painful just tender if touching.  No change in vision.  Slight crusting of right eye in the am.   Past Medical History:  Diagnosis Date   Depression    Fatty liver    GERD (gastroesophageal reflux disease)    Hyperlipidemia    Hypertension     Past Surgical History:  Procedure Laterality Date   ADENOIDECTOMY     VASECTOMY      Family History  Problem Relation Age of Onset   Hypertension Mother    Cancer Father        Lung CA, smoker   Hypertension Father    Prostate cancer Neg Hx    Colon cancer Neg Hx     Social History   Socioeconomic History   Marital status: Married    Spouse name: Not on file   Number of children: 2   Years of education: Not on file   Highest education level: Not on file  Occupational History   Occupation: Copy: [employer]  Tobacco Use   Smoking status: Former    Packs/day: 1.00    Years: 14.00    Total pack years: 14.00    Types: Cigarettes    Start date: 09/15/1984    Quit date: 09/15/1997    Years since quitting: 24.5    Passive exposure: Past   Smokeless tobacco: Never  Substance and Sexual Activity   Alcohol use: Not Currently    Comment: none as of 2018   Drug use: No   Sexual activity: Not on file  Other Topics Concern   Not on file  Social History Narrative   From [place].     Married 1998, Divorced ~2011   2 sons (born 36 and 68)    His younger son lives with him   Social  Determinants of Radio broadcast assistant Strain: Not on file  Food Insecurity: Not on file  Transportation Needs: Not on file  Physical Activity: Not on file  Stress: Not on file  Social Connections: Not on file  Intimate Partner Violence: Not on file    Outpatient Medications Prior to Visit  Medication Sig Dispense Refill   amLODipine (NORVASC) 10 MG tablet TAKE 1 TABLET BY MOUTH EVERY DAY 90 tablet 3   hydrochlorothiazide (MICROZIDE) 12.5 MG capsule TAKE 1 CAPSULE BY MOUTH EVERY DAY 90 capsule 3   scopolamine (TRANSDERM-SCOP, 1.5 MG,) 1 MG/3DAYS Place 1 patch (1.5 mg total) onto the skin every 3 (three) days. 4 patch 1   sildenafil (REVATIO) 20 MG tablet Take 1-5 tablets (20-100 mg total) by mouth 3 (three) times daily. 50 tablet 12   No facility-administered medications prior to visit.    Allergies  Allergen Reactions   Ace Inhibitors     angioedema   Angiotensin Receptor Blockers     Angioedema with ACE  Objective:    Physical Exam Constitutional:      General: He is not in acute distress.    Appearance: Normal appearance. He is normal weight. He is not ill-appearing, toxic-appearing or diaphoretic.  Eyes:     General:        Right eye: Hordeolum (right internal eyelid with redness and tender nodule) present.     Conjunctiva/sclera:     Right eye: Right conjunctiva is not injected.     Left eye: Left conjunctiva is not injected.  Pulmonary:     Effort: Pulmonary effort is normal.  Neurological:     Mental Status: He is alert.     BP 122/64   Pulse 60   Temp 98.6 F (37 C)   Resp 16   Ht '5\' 10"'$  (1.778 m)   Wt 203 lb 6 oz (92.3 kg)   SpO2 98%   BMI 29.18 kg/m  Wt Readings from Last 3 Encounters:  03/31/22 203 lb 6 oz (92.3 kg)  03/27/22 201 lb 3.2 oz (91.3 kg)  03/24/22 202 lb (91.6 kg)     Health Maintenance Due  Topic Date Due   COLONOSCOPY (Pts 45-10yr Insurance coverage will need to be confirmed)  Never done   Zoster Vaccines-  Shingrix (1 of 2) Never done    There are no preventive care reminders to display for this patient.  Lab Results  Component Value Date   TSH 0.73 06/26/2008   Lab Results  Component Value Date   WBC 8.2 05/30/2015   HGB 15.9 05/30/2015   HCT 45.5 05/30/2015   MCV 92.3 05/30/2015   PLT 217.0 05/30/2015   Lab Results  Component Value Date   NA 140 03/17/2022   K 3.7 03/17/2022   CO2 26 03/17/2022   GLUCOSE 100 (H) 03/17/2022   BUN 22 03/17/2022   CREATININE 1.11 03/17/2022   BILITOT 0.8 03/17/2022   ALKPHOS 66 03/17/2022   AST 18 03/17/2022   ALT 17 03/17/2022   PROT 6.8 03/17/2022   ALBUMIN 4.3 03/17/2022   CALCIUM 9.3 03/17/2022   ANIONGAP 7 05/17/2013   GFR 76.16 03/17/2022   Lab Results  Component Value Date   HGBA1C 5.5 02/26/2021      Assessment & Plan:   Problem List Items Addressed This Visit       Other   Hordeolum externum of right upper eyelid - Primary    Warm compresses to upper eyelid  rx maxitrol 3.50-10000-0.1 susp   Discussed how to wash eye appropriately with warm warm cloth from inner to outer canthus. Wash bed sheets and pillow cases to prevent re-infection. Frequent handwashing. If no improvement in 24-48 hours with eye drops as prescribed, please call office.        Relevant Medications   neomycin-polymyxin b-dexamethasone (MAXITROL) 3.5-10000-0.1 SUSP    Meds ordered this encounter  Medications   neomycin-polymyxin b-dexamethasone (MAXITROL) 3.5-10000-0.1 SUSP    Sig: Place 2 drops into the right eye every 4 (four) hours for 7 days.    Dispense:  4.2 mL    Refill:  0    Order Specific Question:   Supervising Provider    Answer:   BEDSOLE, AMY E [[id number]]    Follow-up: Return if symptoms worsen or fail to improve see pcp.    TEugenia Pancoast FNP

## 2022-03-31 NOTE — Assessment & Plan Note (Signed)
Warm compresses to upper eyelid  rx maxitrol 3.50-10000-0.1 susp   Discussed how to wash eye appropriately with warm warm cloth from inner to outer canthus. Wash bed sheets and pillow cases to prevent re-infection. Frequent handwashing. If no improvement in 24-48 hours with eye drops as prescribed, please call office.

## 2022-04-03 ENCOUNTER — Other Ambulatory Visit: Payer: Self-pay | Admitting: Family

## 2022-04-03 ENCOUNTER — Encounter: Payer: Self-pay | Admitting: Family

## 2022-04-03 DIAGNOSIS — H00011 Hordeolum externum right upper eyelid: Secondary | ICD-10-CM

## 2022-04-03 NOTE — Progress Notes (Signed)
Amb ref

## 2022-04-04 ENCOUNTER — Encounter: Payer: Self-pay | Admitting: *Deleted

## 2022-06-05 IMAGING — US US ABDOMEN LIMITED
1 series · 14 of 25 positions shown · non-contrast
Comparison: None.

CLINICAL DATA: Elevated liver function test

EXAM:
ULTRASOUND ABDOMEN LIMITED RIGHT UPPER QUADRANT

[Series 1: us abdomen limited · 0.22mm/px · 14 of 49 slices shown]
[im 1/49]
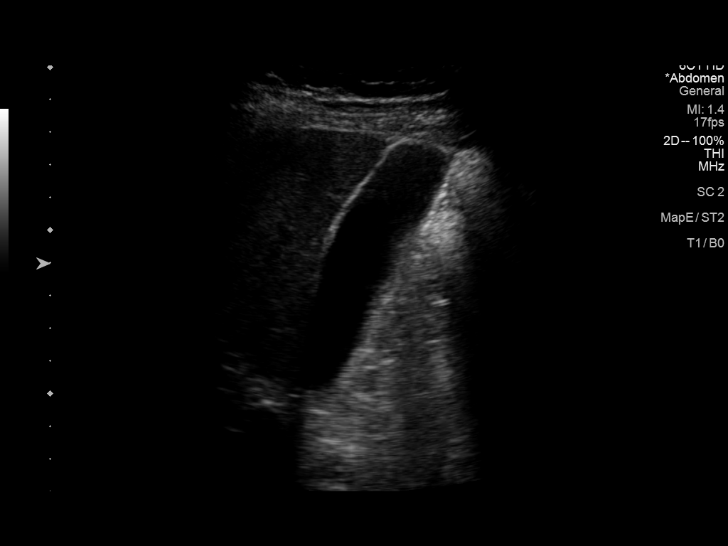
[im 5/49]
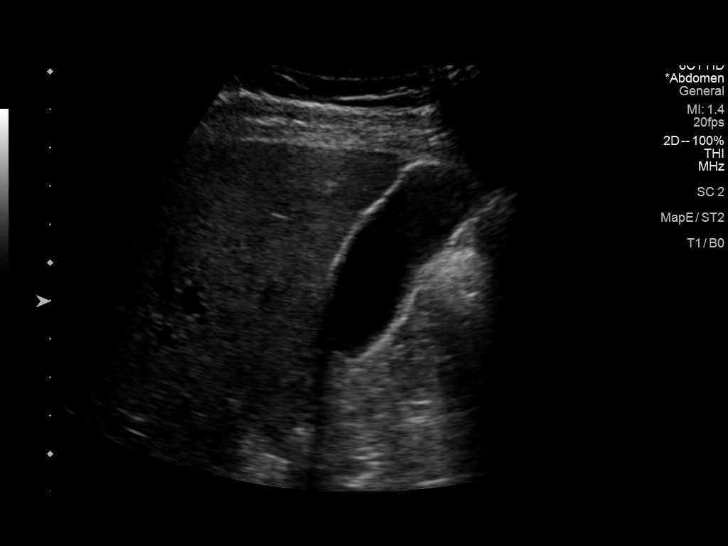
[im 9/49]
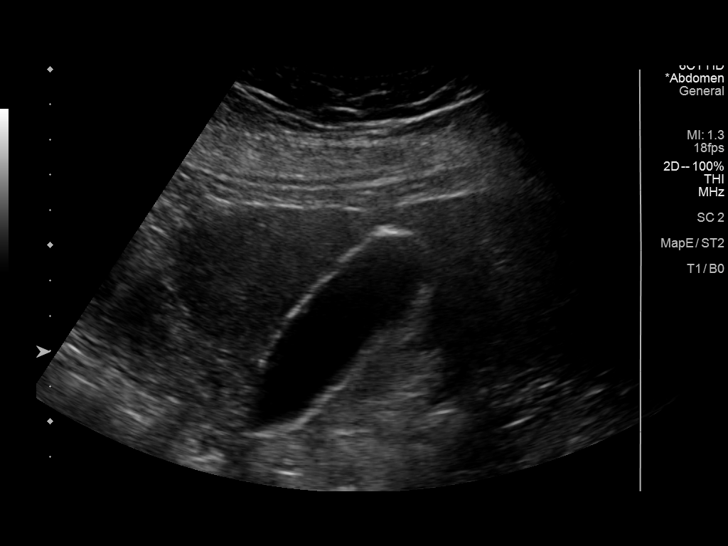
[im 13/49]
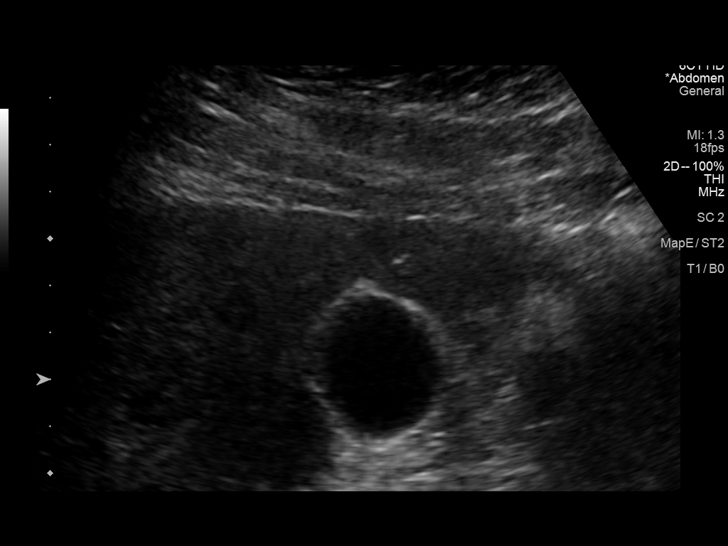
[im 17/49]
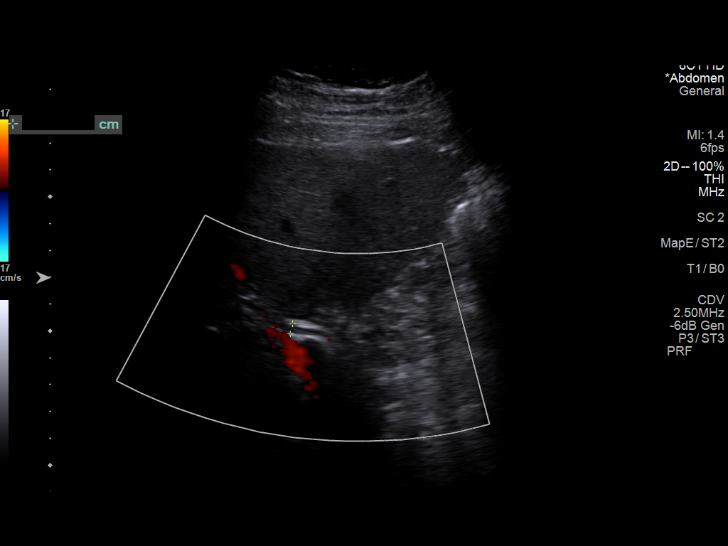
[im 19/49]
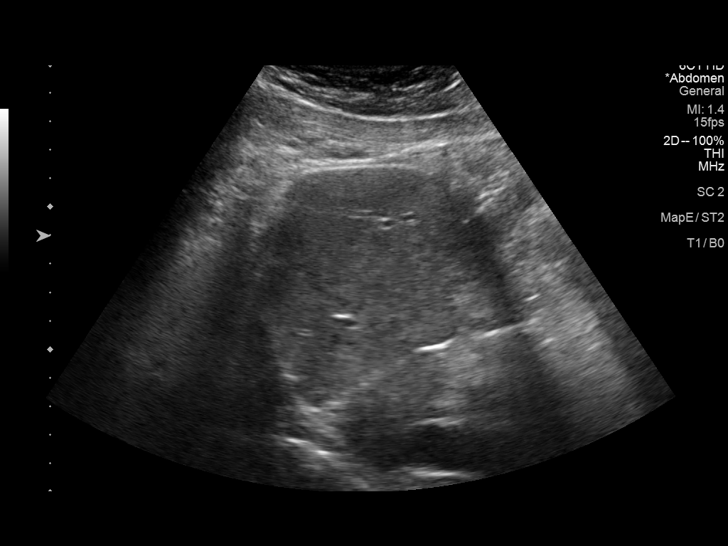
[im 23/49]
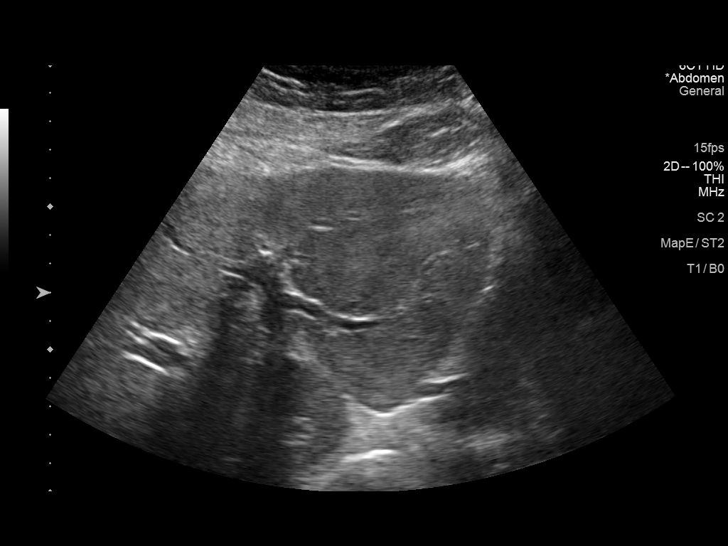
[im 27/49]
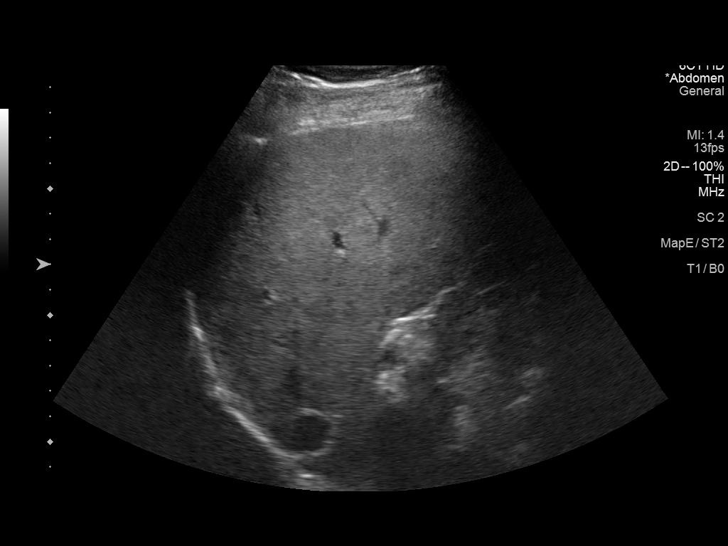
[im 31/49]
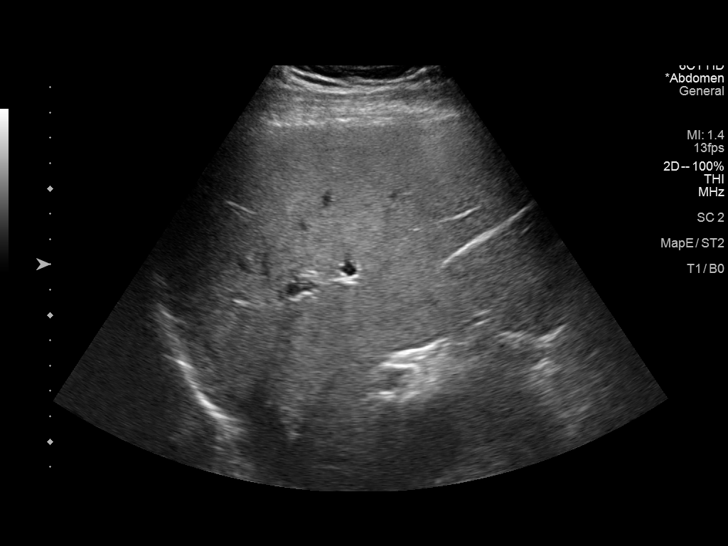
[im 33/49]
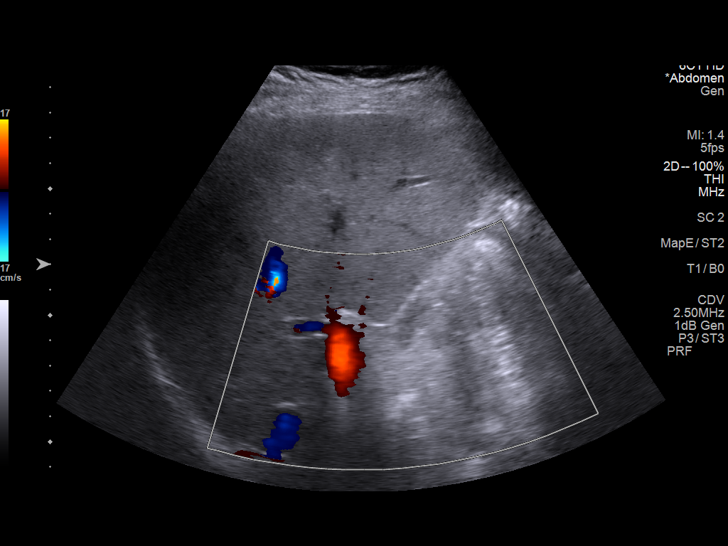
[im 37/49]
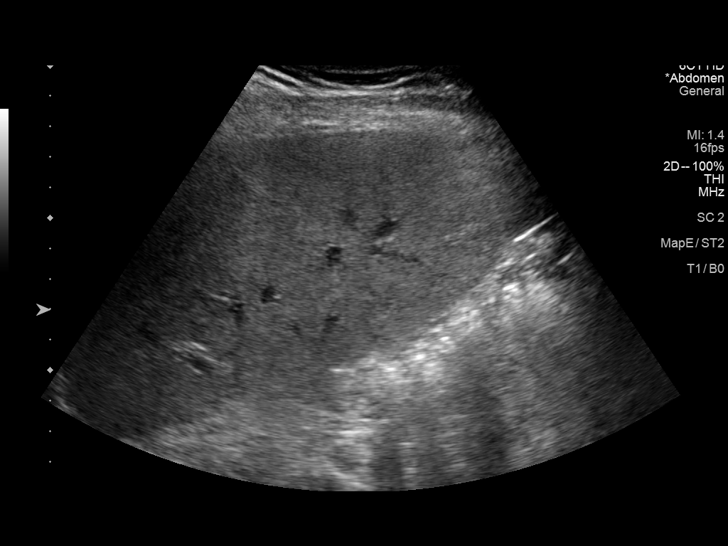
[im 41/49]
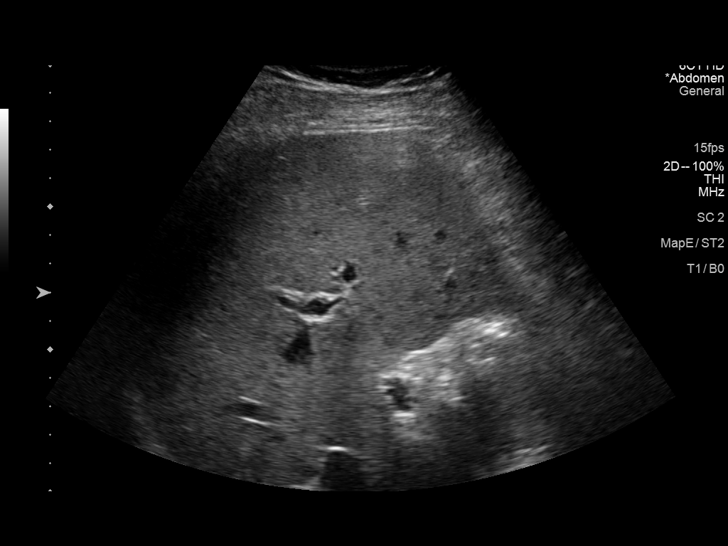
[im 45/49]
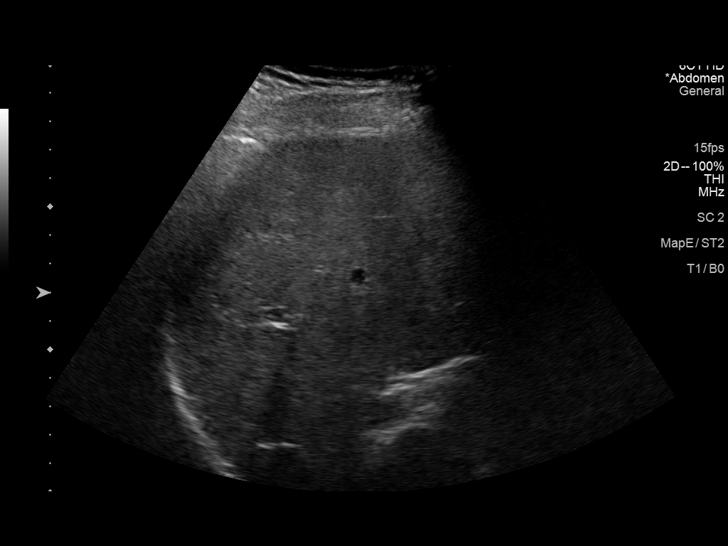
[im 49/49]
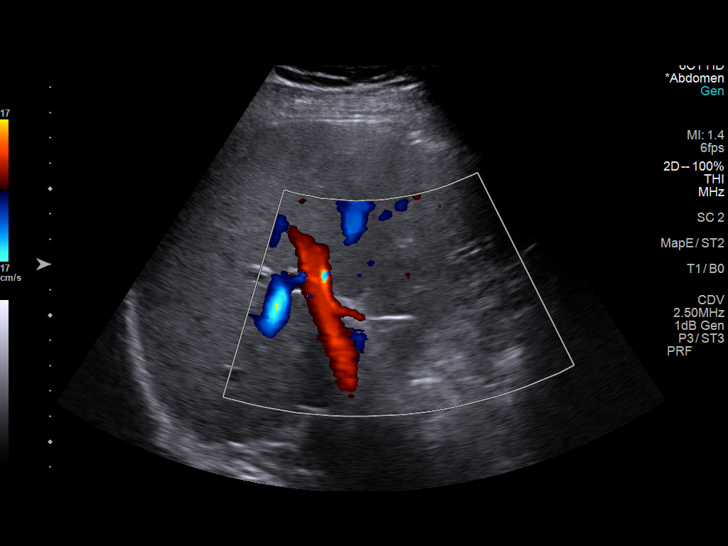

[14 of 25 positions shown; findings below may reference images not displayed]

FINDINGS: Gallbladder:

No gallstones or wall thickening visualized. No sonographic Murphy
sign noted by sonographer.

Common bile duct:

Diameter: 4 mm

Liver:

No focal lesion.

Diffusely increased parenchymal echogenicity.

Portal vein is patent on color Doppler imaging with normal direction
of blood flow towards the liver.

Other: None.
IMPRESSION: Diffuse increased echogenicity of the hepatic parenchyma is a
nonspecific indicator of hepatocellular dysfunction, most commonly
steatosis.

## 2022-12-08 ENCOUNTER — Telehealth: Payer: Self-pay | Admitting: Family Medicine

## 2022-12-08 DIAGNOSIS — Z1211 Encounter for screening for malignant neoplasm of colon: Secondary | ICD-10-CM

## 2022-12-08 NOTE — Telephone Encounter (Signed)
Patient sent a mychart message stating he received a letter from [facility] about a past due colon cancer screening. He would like to use [facility] as a screening option. Patient was last seen on 03/24/2022 for a CPE, asked if he needed to be seen in the office before this request is sent in. Please advise, thank you!

## 2022-12-08 NOTE — Telephone Encounter (Signed)
Ordered.  Would have discussed with patient at next CPE o/w.  Thanks.

## 2022-12-08 NOTE — Telephone Encounter (Signed)
Replied to patient's mychart message.

## 2022-12-18 DIAGNOSIS — Z1211 Encounter for screening for malignant neoplasm of colon: Secondary | ICD-10-CM | POA: Diagnosis not present

## 2022-12-25 LAB — COLOGUARD: COLOGUARD: NEGATIVE

## 2023-01-18 ENCOUNTER — Encounter: Payer: Self-pay | Admitting: Family Medicine

## 2023-01-19 ENCOUNTER — Other Ambulatory Visit: Payer: Self-pay | Admitting: Family Medicine

## 2023-01-19 MED ORDER — SCOPOLAMINE 1 MG/3DAYS TD PT72: 1.0000 | MEDICATED_PATCH | TRANSDERMAL | 1 refills | Status: DC

## 2023-02-08 ENCOUNTER — Other Ambulatory Visit: Payer: Self-pay | Admitting: Family Medicine

## 2023-03-15 ENCOUNTER — Other Ambulatory Visit: Payer: Self-pay | Admitting: Family Medicine

## 2023-03-15 DIAGNOSIS — I10 Essential (primary) hypertension: Secondary | ICD-10-CM

## 2023-03-18 ENCOUNTER — Other Ambulatory Visit (INDEPENDENT_AMBULATORY_CARE_PROVIDER_SITE_OTHER): Payer: BC Managed Care – PPO

## 2023-03-18 DIAGNOSIS — I10 Essential (primary) hypertension: Secondary | ICD-10-CM

## 2023-03-18 LAB — LIPID PANEL
Cholesterol: 155 mg/dL (ref 0–200)
HDL: 47.5 mg/dL (ref >39.00)
LDL Cholesterol: 96 mg/dL (ref 0–99)
NonHDL: 107.11
Total CHOL/HDL Ratio: 3
Triglycerides: 57 mg/dL (ref 0.0–149.0)
VLDL: 11.4 mg/dL (ref 0.0–40.0)

## 2023-03-18 LAB — COMPREHENSIVE METABOLIC PANEL
ALT: 21 U/L (ref 0–53)
AST: 24 U/L (ref 0–37)
Albumin: 4.3 g/dL (ref 3.5–5.2)
Alkaline Phosphatase: 73 U/L (ref 39–117)
BUN: 22 mg/dL (ref 6–23)
CO2: 28 mEq/L (ref 19–32)
Calcium: 9.9 mg/dL (ref 8.4–10.5)
Chloride: 102 mEq/L (ref 96–112)
Creatinine, Ser: 1.09 mg/dL (ref 0.40–1.50)
GFR: 77.3 mL/min (ref >60.00)
Glucose, Bld: 99 mg/dL (ref 70–99)
Potassium: 3.6 mEq/L (ref 3.5–5.1)
Sodium: 139 mEq/L (ref 135–145)
Total Bilirubin: 0.6 mg/dL (ref 0.2–1.2)
Total Protein: 7.3 g/dL (ref 6.0–8.3)

## 2023-03-26 ENCOUNTER — Encounter: Payer: Self-pay | Admitting: Family Medicine

## 2023-03-26 ENCOUNTER — Ambulatory Visit (INDEPENDENT_AMBULATORY_CARE_PROVIDER_SITE_OTHER)
Admission: RE | Admit: 2023-03-26 | Discharge: 2023-03-26 | Disposition: A | Discharge status: Home / Self Care | Payer: BC Managed Care – PPO | Source: Ambulatory Visit | Attending: Family Medicine | Admitting: Family Medicine

## 2023-03-26 ENCOUNTER — Ambulatory Visit (INDEPENDENT_AMBULATORY_CARE_PROVIDER_SITE_OTHER): Payer: BC Managed Care – PPO | Admitting: Family Medicine

## 2023-03-26 VITALS — BP 116/80 | HR 60 | Temp 97.5°F | Ht 70.0 in | Wt 200.0 lb

## 2023-03-26 DIAGNOSIS — Z0000 Encounter for general adult medical examination without abnormal findings: Secondary | ICD-10-CM | POA: Diagnosis not present

## 2023-03-26 DIAGNOSIS — M25512 Pain in left shoulder: Secondary | ICD-10-CM

## 2023-03-26 DIAGNOSIS — T753XXA Motion sickness, initial encounter: Secondary | ICD-10-CM

## 2023-03-26 DIAGNOSIS — M85812 Other specified disorders of bone density and structure, left shoulder: Secondary | ICD-10-CM | POA: Diagnosis not present

## 2023-03-26 DIAGNOSIS — N529 Male erectile dysfunction, unspecified: Secondary | ICD-10-CM

## 2023-03-26 DIAGNOSIS — Z7189 Other specified counseling: Secondary | ICD-10-CM

## 2023-03-26 DIAGNOSIS — M19012 Primary osteoarthritis, left shoulder: Secondary | ICD-10-CM | POA: Diagnosis not present

## 2023-03-26 MED ORDER — SILDENAFIL CITRATE 20 MG PO TABS: 20.0000 mg | ORAL_TABLET | Freq: Every day | ORAL | 12 refills | Status: DC | PRN

## 2023-03-26 MED ORDER — AMLODIPINE BESYLATE 10 MG PO TABS: 10.0000 mg | ORAL_TABLET | Freq: Every day | ORAL | 3 refills | Status: DC

## 2023-03-26 MED ORDER — SCOPOLAMINE 1 MG/3DAYS TD PT72
1.0000 | MEDICATED_PATCH | TRANSDERMAL | 1 refills | Status: DC
Start: 2023-03-26 — End: 2023-11-11

## 2023-03-26 MED ORDER — HYDROCHLOROTHIAZIDE 12.5 MG PO CAPS: ORAL_CAPSULE | ORAL | 3 refills | Status: DC

## 2023-03-26 NOTE — Assessment & Plan Note (Signed)
Living will d/w pt. Wife Elnita Maxwell designated if patient were incapacitated.  Then his son Doylene Canard designated if needed.

## 2023-03-26 NOTE — Progress Notes (Signed)
CPE- See plan.  Routine anticipatory guidance given to patient.  See health maintenance.  The possibility exists that previously documented standard health maintenance information may have been brought forward from a previous encounter into this note.  If needed, that same information has been updated to reflect the current situation based on today's encounter.    Tetanus 2016  Flu encouraged for fall. PNA not due shingles d/w pt.   Cologuard done 2024 Prostate cancer screening and PSA options (with potential risks and benefits of testing vs not testing) were discussed along with recent recs/guidelines. He declined testing PSA at this point. Living will d/w pt. Wife Elnita Maxwell designated if patient were incapacitated.  Then his son Doylene Canard designated if needed.   Diet and exercise d/w pt.  Prev 30 lbs weight loss with diet and exercise.  Weight is stable in the meantime.    Remarried in March.  D/w pt.    Nasal congestion noted.  No nose bleeds.    Nocturia x2, he is tolerating for now.  He'll update me as needed.    Hypertension:                          Using medication without problems or lightheadedness: yes Chest pain with exertion:no Edema:no Short of breath:no Labs d/w pt.    ED.  Sildenafil helped.  No ADE on med.  No NTG use.     Scopolamine helped with motion sickness.  D/w pt. he can use when needed.  L>R shoulder pain with crepitus noted.  Still going to the gym.  Can have discomfort overhead, some positional pain.  He isn't lifting heavy at the gym.  He doesn't do overhead press now.    PMH and SH reviewed  Meds, vitals, and allergies reviewed.   ROS: Per HPI.  Unless specifically indicated otherwise in HPI, the patient denies:  General: fever. Eyes: acute vision changes ENT: sore throat Cardiovascular: chest pain Respiratory: SOB GI: vomiting GU: dysuria Musculoskeletal: acute back pain Derm: acute rash Neuro: acute motor dysfunction Psych: worsening  mood Endocrine: polydipsia Heme: bleeding Allergy: hayfever  GEN: nad, alert and oriented HEENT: mucous membranes moist, TM wnl, nasal congestion B, OP wnl NECK: supple w/o LA CV: rrr. PULM: ctab, no inc wob ABD: soft, +bs EXT: no edema SKIN: no acute rash L shoulder with faint crepitus.  Supraspinatus testing positive.  Anterior GH joint line ttp.  Proximal biceps sore on testing.

## 2023-03-26 NOTE — Patient Instructions (Addendum)
Check with your insurance to see if they will cover the shingles shot. Take care.  Glad to see you. I would get a flu shot each fall.   Go to the lab on the way out.   If you have mychart we'll likely use that to update you.

## 2023-03-29 DIAGNOSIS — M25511 Pain in right shoulder: Secondary | ICD-10-CM | POA: Insufficient documentation

## 2023-03-29 DIAGNOSIS — M25512 Pain in left shoulder: Secondary | ICD-10-CM | POA: Insufficient documentation

## 2023-03-29 DIAGNOSIS — T753XXA Motion sickness, initial encounter: Secondary | ICD-10-CM | POA: Insufficient documentation

## 2023-03-29 DIAGNOSIS — Z0000 Encounter for general adult medical examination without abnormal findings: Secondary | ICD-10-CM | POA: Insufficient documentation

## 2023-03-29 NOTE — Assessment & Plan Note (Signed)
Sildenafil helped.  No ADE on med.  No NTG use.  Continue as is.

## 2023-03-29 NOTE — Assessment & Plan Note (Signed)
Concern for rotator cuff pathology.  See notes on plain films.

## 2023-03-29 NOTE — Assessment & Plan Note (Signed)
Tetanus 2016  Flu encouraged for fall. PNA not due shingles d/w pt.   Cologuard done 2024 Prostate cancer screening and PSA options (with potential risks and benefits of testing vs not testing) were discussed along with recent recs/guidelines. He declined testing PSA at this point. Living will d/w pt. Wife Elnita Maxwell designated if patient were incapacitated.  Then his son Doylene Canard designated if needed.   Diet and exercise d/w pt.  Prev 30 lbs weight loss with diet and exercise.  Weight is stable in the meantime.

## 2023-03-29 NOTE — Assessment & Plan Note (Signed)
Scopolamine helped with motion sickness.  D/w pt. he can use when needed.

## 2023-04-03 ENCOUNTER — Other Ambulatory Visit: Payer: Self-pay | Admitting: Family Medicine

## 2023-04-03 DIAGNOSIS — M25512 Pain in left shoulder: Secondary | ICD-10-CM

## 2023-04-15 ENCOUNTER — Ambulatory Visit (INDEPENDENT_AMBULATORY_CARE_PROVIDER_SITE_OTHER): Payer: BC Managed Care – PPO | Admitting: Orthopaedic Surgery

## 2023-04-15 DIAGNOSIS — M19011 Primary osteoarthritis, right shoulder: Secondary | ICD-10-CM | POA: Diagnosis not present

## 2023-04-15 DIAGNOSIS — M25511 Pain in right shoulder: Secondary | ICD-10-CM

## 2023-04-15 DIAGNOSIS — M19012 Primary osteoarthritis, left shoulder: Secondary | ICD-10-CM | POA: Diagnosis not present

## 2023-04-15 DIAGNOSIS — M25512 Pain in left shoulder: Secondary | ICD-10-CM

## 2023-04-15 MED ORDER — TRIAMCINOLONE ACETONIDE 40 MG/ML IJ SUSP
80.0000 mg | INTRAMUSCULAR | Status: AC | PRN
Start: 2023-04-15 — End: 2023-04-15
  Administered 2023-04-15: 80 mg via INTRA_ARTICULAR

## 2023-04-15 MED ORDER — LIDOCAINE HCL 1 % IJ SOLN
4.0000 mL | INTRAMUSCULAR | Status: AC | PRN
Start: 2023-04-15 — End: 2023-04-15
  Administered 2023-04-15: 4 mL

## 2023-04-15 NOTE — Progress Notes (Signed)
Chief Complaint: Bilateral shoulder pain     History of Present Illness:    Scott Franco is a 52 y.o. male right-hand-dominant male presents with bilateral shoulder pain which has been ongoing now for multiple years.  He states that the left shoulder has been getting worse over the last several months.  He has pain with any type of overhead press and has had to moderate and change multiple workouts in order to limit shoulder motion.  He has been experiencing popping particularly on the left.  He has not had any previous injections or physical therapy.  Denies any instability.  This tenderness is predominantly in the shoulder joint.  He does enjoy working out and being active although this has been quite limited as result of the shoulder pain    Surgical History:   None  PMH/PSH/Family History/Social History/Meds/Allergies:    Past Medical History:  Diagnosis Date  . Depression   . Fatty liver   . GERD (gastroesophageal reflux disease)   . Hyperlipidemia   . Hypertension    Past Surgical History:  Procedure Laterality Date  . ADENOIDECTOMY    . VASECTOMY     Social History   Socioeconomic History  . Marital status: Married    Spouse name: Not on file  . Number of children: 2  . Years of education: Not on file  . Highest education level: Not on file  Occupational History  . Occupation: Curator: [employer]  Tobacco Use  . Smoking status: Former    Current packs/day: 0.00    Average packs/day: 1 pack/day for 14.0 years (14.0 ttl pk-yrs)    Types: Cigarettes    Start date: 09/15/1984    Quit date: 09/15/1997    Years since quitting: 25.5    Passive exposure: Past  . Smokeless tobacco: Never  Substance and Sexual Activity  . Alcohol use: Not Currently    Comment: none as of 2018  . Drug use: No  . Sexual activity: Not on file  Other Topics Concern  . Not on file  Social History  Narrative   From [facility].     Married 1998, Divorced ~2011   Remarried 2024   2 sons (born 37 and 2005)    Social Determinants of Health   Financial Resource Strain: Not on file  Food Insecurity: Not on file  Transportation Needs: Not on file  Physical Activity: Not on file  Stress: Not on file  Social Connections: Not on file   Family History  Problem Relation Age of Onset  . Hypertension Mother   . Cancer Father        Lung CA, smoker  . Hypertension Father   . Prostate cancer Neg Hx   . Colon cancer Neg Hx    Allergies  Allergen Reactions  . Ace Inhibitors     angioedema  . Angiotensin Receptor Blockers     Angioedema with ACE   Current Outpatient Medications  Medication Sig Dispense Refill  . amLODipine (NORVASC) 10 MG tablet Take 1 tablet (10 mg total) by mouth daily. 90 tablet 3  . hydrochlorothiazide (MICROZIDE) 12.5 MG capsule TAKE 1 CAPSULE BY MOUTH EVERY DAY 90 capsule 3  . scopolamine (TRANSDERM-SCOP, 1.5 MG,) 1 MG/3DAYS Place 1 patch (1.5  mg total) onto the skin every 3 (three) days. 4 patch 1  . sildenafil (REVATIO) 20 MG tablet Take 1-5 tablets (20-100 mg total) by mouth daily as needed. 50 tablet 12   No current facility-administered medications for this visit.   No results found.  Review of Systems:   A ROS was performed including pertinent positives and negatives as documented in the HPI.  Physical Exam :   Constitutional: NAD and appears stated age Neurological: Alert and oriented Psych: Appropriate affect and cooperative There were no vitals taken for this visit.   Comprehensive Musculoskeletal Exam:    Musculoskeletal Exam    Inspection Right Left  Skin No atrophy or winging No atrophy or winging  Palpation    Tenderness Glenohumeral Glenohumeral  Range of Motion    Flexion (passive) 150 150  Flexion (active) 150 150  Abduction 140 140  ER at the side 70 70  Can reach behind back to L1 L1  Strength     Full with pain Full  with paiin  Special Tests    Pseudoparalytic No No  Neurologic    Fires PIN, radial, median, ulnar, musculocutaneous, axillary, suprascapular, long thoracic, and spinal accessory innervated muscles. No abnormal sensibility  Vascular/Lymphatic    Radial Pulse 2+ 2+  Cervical Exam    Patient has symmetric cervical range of motion with negative Spurling's test.  Special Test:      Imaging:   Xray (3 views left shoulder): Moderate glenohumeral osteoarthritis with inferior humeral osteophyte   I personally reviewed and interpreted the radiographs.   Assessment:   52 y.o. male with bilateral shoulder pain consistent with likely left worse than right glenohumeral osteoarthritis.  At this time he would like to begin with initial therapy with bilateral ultrasound-guided injections.  We did also briefly discuss shoulder arthroplasty although given the fact that he has not had previous injections he would like to start with this.  Plan :    -Bilateral ultrasound-guided injections performed and verbal consent obtained    Procedure Note  Patient: Scott Franco             Date of Birth: 1971-05-15           MRN: [id number]             Visit Date: 04/15/2023  Procedures: Visit Diagnoses: No diagnosis found.  Large Joint Inj: R glenohumeral on 04/15/2023 5:18 PM Indications: pain Details: 22 G 1.5 in needle, ultrasound-guided anterior approach  Arthrogram: No  Medications: 4 mL lidocaine 1 %; 80 mg triamcinolone acetonide 40 MG/ML Outcome: tolerated well, no immediate complications Procedure, treatment alternatives, risks and benefits explained, specific risks discussed. Consent was given by the patient. Immediately prior to procedure a time out was called to verify the correct patient, procedure, equipment, support staff and site/side marked as required. Patient was prepped and draped in the usual sterile fashion.    Large Joint Inj: L glenohumeral on 04/15/2023 5:18  PM Indications: pain Details: 22 G 1.5 in needle, ultrasound-guided anterior approach  Arthrogram: No  Medications: 4 mL lidocaine 1 %; 80 mg triamcinolone acetonide 40 MG/ML Outcome: tolerated well, no immediate complications Procedure, treatment alternatives, risks and benefits explained, specific risks discussed. Consent was given by the patient. Immediately prior to procedure a time out was called to verify the correct patient, procedure, equipment, support staff and site/side marked as required. Patient was prepped and draped in the usual sterile fashion.  I personally saw and evaluated the patient, and participated in the management and treatment plan.  Huel Cote, MD Attending Physician, Orthopedic Surgery  This document was dictated using Dragon voice recognition software. A reasonable attempt at proof reading has been made to minimize errors.

## 2023-11-11 ENCOUNTER — Other Ambulatory Visit: Payer: Self-pay | Admitting: Family Medicine

## 2023-11-11 MED ORDER — SCOPOLAMINE 1 MG/3DAYS TD PT72: 1.0000 | MEDICATED_PATCH | TRANSDERMAL | 1 refills | Status: DC

## 2023-11-11 NOTE — Telephone Encounter (Signed)
 Last refill: scopolamine (TRANSDERM-SCOP, 1.5 MG,) 1 MG/3DAYS 03/26/23 4 patches 1 refill Last office visit: 03/26/23 Next office visit: nothing scheduled

## 2023-11-11 NOTE — Telephone Encounter (Signed)
 Copied from CRM [id number]. Topic: Clinical - Medication Refill >> Nov 11, 2023  9:18 AM Florestine Avers wrote: Most Recent Primary Care Visit:  Provider: Joaquim Nam  Department: [facility]  Visit Type: PHYSICAL  Date: 03/26/2023  Medication: scopolamine (TRANSDERM-SCOP, 1.5 MG,) 1 MG/3DAYS  Has the patient contacted their pharmacy? Yes (Agent: If no, request that the patient contact the pharmacy for the refill. If patient does not wish to contact the pharmacy document the reason why and proceed with request.) (Agent: If yes, when and what did the pharmacy advise?)  Is this the correct pharmacy for this prescription? Yes If no, delete pharmacy and type the correct one.  This is the patient's preferred pharmacy:  [id number] - [place], [place] - [address] [place] [address] Phone: [phone] Fax: [phone]  Has the prescription been filled recently? No  Is the patient out of the medication? Yes  Has the patient been seen for an appointment in the last year OR does the patient have an upcoming appointment? Yes  Can we respond through MyChart? Yes  Agent: Please be advised that Rx refills may take up to 3 business days. We ask that you follow-up with your pharmacy.

## 2023-11-12 ENCOUNTER — Telehealth: Payer: Self-pay

## 2023-11-12 MED ORDER — POLYMYXIN B-TRIMETHOPRIM 10000-0.1 UNIT/ML-% OP SOLN: OPHTHALMIC | 0 refills | Status: AC

## 2023-11-12 NOTE — Telephone Encounter (Signed)
Rx sent.  See mychart message.  

## 2023-11-12 NOTE — Telephone Encounter (Signed)
 Patient states that he currently has a stye (for about a week) and was wondering can we send the prescription for the neomycin-polymyxin b-dexamethasone (MAXITROL) 3.5-10000-0.1 SUSP. He recalls that it worked in the past. He he has not seen anyone in reference to the stye. Please advise.

## 2023-11-12 NOTE — Telephone Encounter (Signed)
 Copied from CRM [id number]. Topic: Clinical - Prescription Issue >> Nov 12, 2023 11:45 AM Orinda Kenner C wrote: Reason for CRM: Patient states wrong medication was sent to the pharmacy. Patient does not remember name, but it's an eye drops for stye. Maybe maybe Neomycin-Polymyxin-Dexameth 3.5-10000-0.12 drops Every 4 hours. Please send medication to [id number] - [place], [place] - [address] [address] Phone:[phone]:[phone] Please call back at [phone] to verify,

## 2023-11-12 NOTE — Addendum Note (Signed)
 Addended by: Joaquim Nam on: 11/12/2023 05:11 PM   Modules accepted: Orders

## 2024-04-08 ENCOUNTER — Telehealth: Payer: Self-pay | Admitting: Family Medicine

## 2024-04-08 NOTE — Telephone Encounter (Signed)
 Copied from CRM [id number]. Topic: Clinical - Request for Lab/Test Order >> Apr 08, 2024  9:43 AM Armenia J wrote: Reason for CRM: Patient would like to request a basic panel of labs to be ordered so so he can get that done prior to his physical.

## 2024-04-25 ENCOUNTER — Other Ambulatory Visit: Payer: Self-pay | Admitting: Family Medicine

## 2024-04-25 DIAGNOSIS — I10 Essential (primary) hypertension: Secondary | ICD-10-CM

## 2024-04-28 ENCOUNTER — Other Ambulatory Visit (INDEPENDENT_AMBULATORY_CARE_PROVIDER_SITE_OTHER)

## 2024-04-28 DIAGNOSIS — I10 Essential (primary) hypertension: Secondary | ICD-10-CM | POA: Diagnosis not present

## 2024-04-28 LAB — COMPREHENSIVE METABOLIC PANEL WITH GFR
ALT: 26 U/L (ref 0–53)
AST: 26 U/L (ref 0–37)
Albumin: 4.5 g/dL (ref 3.5–5.2)
Alkaline Phosphatase: 75 U/L (ref 39–117)
BUN: 16 mg/dL (ref 6–23)
CO2: 29 meq/L (ref 19–32)
Calcium: 9.5 mg/dL (ref 8.4–10.5)
Chloride: 101 meq/L (ref 96–112)
Creatinine, Ser: 1.04 mg/dL (ref 0.40–1.50)
GFR: 81.14 mL/min (ref >60.00)
Glucose, Bld: 102 mg/dL — ABNORMAL HIGH (ref 70–99)
Potassium: 3.8 meq/L (ref 3.5–5.1)
Sodium: 139 meq/L (ref 135–145)
Total Bilirubin: 0.7 mg/dL (ref 0.2–1.2)
Total Protein: 7.5 g/dL (ref 6.0–8.3)

## 2024-04-28 LAB — LIPID PANEL
Cholesterol: 176 mg/dL (ref 0–200)
HDL: 55.7 mg/dL (ref >39.00)
LDL Cholesterol: 101 mg/dL — ABNORMAL HIGH (ref 0–99)
NonHDL: 120.12
Total CHOL/HDL Ratio: 3
Triglycerides: 96 mg/dL (ref 0.0–149.0)
VLDL: 19.2 mg/dL (ref 0.0–40.0)

## 2024-05-02 ENCOUNTER — Ambulatory Visit: Payer: Self-pay | Admitting: Family Medicine

## 2024-05-05 ENCOUNTER — Ambulatory Visit (INDEPENDENT_AMBULATORY_CARE_PROVIDER_SITE_OTHER): Admitting: Family Medicine

## 2024-05-05 ENCOUNTER — Encounter: Payer: Self-pay | Admitting: Family Medicine

## 2024-05-05 VITALS — BP 128/78 | HR 66 | Temp 98.4°F | Ht 70.16 in | Wt 209.4 lb

## 2024-05-05 DIAGNOSIS — I10 Essential (primary) hypertension: Secondary | ICD-10-CM

## 2024-05-05 DIAGNOSIS — Z0000 Encounter for general adult medical examination without abnormal findings: Secondary | ICD-10-CM | POA: Diagnosis not present

## 2024-05-05 DIAGNOSIS — Z7189 Other specified counseling: Secondary | ICD-10-CM

## 2024-05-05 DIAGNOSIS — N529 Male erectile dysfunction, unspecified: Secondary | ICD-10-CM

## 2024-05-05 DIAGNOSIS — G8929 Other chronic pain: Secondary | ICD-10-CM

## 2024-05-05 DIAGNOSIS — S39011A Strain of muscle, fascia and tendon of abdomen, initial encounter: Secondary | ICD-10-CM

## 2024-05-05 MED ORDER — HYDROCHLOROTHIAZIDE 12.5 MG PO CAPS: ORAL_CAPSULE | ORAL | 3 refills | Status: AC

## 2024-05-05 MED ORDER — AMLODIPINE BESYLATE 10 MG PO TABS: 10.0000 mg | ORAL_TABLET | Freq: Every day | ORAL | 3 refills | Status: AC

## 2024-05-05 MED ORDER — SILDENAFIL CITRATE 20 MG PO TABS: 20.0000 mg | ORAL_TABLET | Freq: Every day | ORAL | 12 refills | Status: AC | PRN

## 2024-05-05 MED ORDER — SCOPOLAMINE 1 MG/3DAYS TD PT72: 1.0000 | MEDICATED_PATCH | TRANSDERMAL | 1 refills | Status: AC
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
## 2024-05-05 NOTE — Patient Instructions (Addendum)
 You could call ortho about a possible repeat injection.   Take care.  Glad to see you. I would get a flu shot each fall.   Update me as needed.

## 2024-05-07 DIAGNOSIS — S39011A Strain of muscle, fascia and tendon of abdomen, initial encounter: Secondary | ICD-10-CM | POA: Insufficient documentation

## 2024-05-07 NOTE — Assessment & Plan Note (Signed)
Living will d/w pt. Wife Scott Franco designated if patient were incapacitated.  Then his son Doylene Canard designated if needed.

## 2024-05-07 NOTE — Assessment & Plan Note (Signed)
 He had some prev abd wall pain on the L lower side w/o mass noted, after doing sit ups.  No pain now.  No mass on exam today. Likely strain that improved in the meantime.  No sx now.  I asked him to update me as needed.

## 2024-05-07 NOTE — Assessment & Plan Note (Signed)
 Tetanus 2016  Flu encouraged for fall. PNA d/w pt.   shingles d/w pt.   Cologuard done 2024 Prostate cancer screening and PSA options (with potential risks and benefits of testing vs not testing) were discussed along with recent recs/guidelines. He declined testing PSA at this point. Living will d/w pt. Wife Channing designated if patient were incapacitated.  Then his son Cameron designated if needed.   Diet and exercise d/w pt.

## 2024-05-07 NOTE — Assessment & Plan Note (Signed)
  Scopolamine  helped with motion sickness with fishing.  D/w pt. He can use when needed.

## 2024-05-07 NOTE — Assessment & Plan Note (Signed)
Sildenafil helped.  No ADE on med.  No NTG use.  Continue as is.

## 2024-05-07 NOTE — Assessment & Plan Note (Signed)
 Continue work on diet and exercise. Continue amlodipine  and hydrochlorothiazide .

## 2024-05-07 NOTE — Assessment & Plan Note (Signed)
 D/w pt about prn ortho follow up.
